# Patient Record
Sex: Female | Born: 1985
Health system: Southern US, Community
[De-identification: ages and names within clinical notes are randomized; demographics above are authoritative.]

## PROBLEM LIST (undated history)

## (undated) DIAGNOSIS — Z860100 Personal history of colon polyps, unspecified: Secondary | ICD-10-CM

## (undated) DIAGNOSIS — K862 Cyst of pancreas: Secondary | ICD-10-CM

## (undated) DIAGNOSIS — E1140 Type 2 diabetes mellitus with diabetic neuropathy, unspecified: Secondary | ICD-10-CM

## (undated) DIAGNOSIS — E119 Type 2 diabetes mellitus without complications: Secondary | ICD-10-CM

## (undated) DIAGNOSIS — I10 Essential (primary) hypertension: Secondary | ICD-10-CM

## (undated) DIAGNOSIS — E282 Polycystic ovarian syndrome: Secondary | ICD-10-CM

## (undated) DIAGNOSIS — K8020 Calculus of gallbladder without cholecystitis without obstruction: Secondary | ICD-10-CM

## (undated) HISTORY — DX: Personal history of colonic polyps: Z86.010

## (undated) HISTORY — DX: Essential (primary) hypertension: I10

## (undated) HISTORY — DX: Type 2 diabetes mellitus without complications: E11.9

## (undated) HISTORY — PX: EXPLORATORY LAPAROTOMY: SUR591

## (undated) HISTORY — DX: Type 2 diabetes mellitus with diabetic neuropathy, unspecified: E11.40

## (undated) HISTORY — DX: Cyst of pancreas: K86.2

## (undated) HISTORY — DX: Personal history of colon polyps, unspecified: Z86.0100

---
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
## 2012-03-26 HISTORY — PX: CHOLECYSTECTOMY: SHX55

## 2013-05-10 LAB — HM DIABETES EYE EXAM

## 2014-04-10 ENCOUNTER — Emergency Department (HOSPITAL_BASED_OUTPATIENT_CLINIC_OR_DEPARTMENT_OTHER)
Admission: EM | Admit: 2014-04-10 | Discharge: 2014-04-10 | Disposition: A | Discharge status: Home / Self Care | Payer: 59 | Attending: Emergency Medicine | Admitting: Emergency Medicine

## 2014-04-10 ENCOUNTER — Encounter (HOSPITAL_BASED_OUTPATIENT_CLINIC_OR_DEPARTMENT_OTHER): Payer: Self-pay | Admitting: Emergency Medicine

## 2014-04-10 ENCOUNTER — Emergency Department (HOSPITAL_BASED_OUTPATIENT_CLINIC_OR_DEPARTMENT_OTHER): Payer: 59

## 2014-04-10 DIAGNOSIS — R110 Nausea: Secondary | ICD-10-CM | POA: Diagnosis not present

## 2014-04-10 DIAGNOSIS — Z79899 Other long term (current) drug therapy: Secondary | ICD-10-CM | POA: Insufficient documentation

## 2014-04-10 DIAGNOSIS — R1032 Left lower quadrant pain: Secondary | ICD-10-CM | POA: Diagnosis present

## 2014-04-10 DIAGNOSIS — Z3202 Encounter for pregnancy test, result negative: Secondary | ICD-10-CM | POA: Insufficient documentation

## 2014-04-10 DIAGNOSIS — Z9049 Acquired absence of other specified parts of digestive tract: Secondary | ICD-10-CM | POA: Diagnosis not present

## 2014-04-10 DIAGNOSIS — E669 Obesity, unspecified: Secondary | ICD-10-CM | POA: Insufficient documentation

## 2014-04-10 DIAGNOSIS — Z8719 Personal history of other diseases of the digestive system: Secondary | ICD-10-CM | POA: Insufficient documentation

## 2014-04-10 HISTORY — DX: Calculus of gallbladder without cholecystitis without obstruction: K80.20

## 2014-04-10 HISTORY — DX: Polycystic ovarian syndrome: E28.2

## 2014-04-10 LAB — URINALYSIS, ROUTINE W REFLEX MICROSCOPIC
BILIRUBIN URINE: NEGATIVE
Glucose, UA: 100 mg/dL — AB
HGB URINE DIPSTICK: NEGATIVE
KETONES UR: NEGATIVE mg/dL
NITRITE: NEGATIVE
Protein, ur: NEGATIVE mg/dL
SPECIFIC GRAVITY, URINE: 1.021 (ref 1.005–1.030)
Urobilinogen, UA: 0.2 mg/dL (ref 0.0–1.0)
pH: 6.5 (ref 5.0–8.0)

## 2014-04-10 LAB — PREGNANCY, URINE: PREG TEST UR: NEGATIVE

## 2014-04-10 LAB — URINE MICROSCOPIC-ADD ON

## 2014-04-10 MED ORDER — MORPHINE SULFATE 4 MG/ML IJ SOLN
4.0000 mg | Freq: Once | INTRAMUSCULAR | Status: AC
  Administered 2014-04-10: 4 mg via INTRAVENOUS
  Filled 2014-04-10: qty 1

## 2014-04-10 MED ORDER — OXYCODONE-ACETAMINOPHEN 5-325 MG PO TABS: 1.0000 | ORAL_TABLET | Freq: Four times a day (QID) | ORAL | Status: DC | PRN

## 2014-04-10 MED ORDER — ONDANSETRON HCL 4 MG/2ML IJ SOLN
4.0000 mg | Freq: Once | INTRAMUSCULAR | Status: AC
  Administered 2014-04-10: 4 mg via INTRAVENOUS
  Filled 2014-04-10: qty 2

## 2014-04-10 MED ORDER — ONDANSETRON 4 MG PO TBDP: ORAL_TABLET | ORAL | Status: DC

## 2014-04-10 MED ORDER — DIPHENHYDRAMINE HCL 50 MG/ML IJ SOLN
25.0000 mg | Freq: Once | INTRAMUSCULAR | Status: AC
  Administered 2014-04-10: 25 mg via INTRAVENOUS
  Filled 2014-04-10: qty 1

## 2014-04-10 NOTE — ED Notes (Signed)
C/o itching, benadryl ordered and given.

## 2014-04-10 NOTE — ED Notes (Signed)
Pt in [place]

## 2014-04-10 NOTE — Discharge Instructions (Signed)
Take percocet for severe pain only. No driving or operating heavy machinery while taking percocet. This medication may cause drowsiness. Take zofran as directed as needed for nausea. Follow up with your OB/GYN as soon as possible.  Abdominal Pain, Women Abdominal (stomach, pelvic, or belly) pain can be caused by many things. It is important to tell your doctor:  The location of the pain.  Does it come and go or is it present all the time?  Are there things that start the pain (eating certain foods, exercise)?  Are there other symptoms associated with the pain (fever, nausea, vomiting, diarrhea)? All of this is helpful to know when trying to find the cause of the pain. CAUSES   Stomach: virus or bacteria infection, or ulcer.  Intestine: appendicitis (inflamed appendix), regional ileitis (Crohn's disease), ulcerative colitis (inflamed colon), irritable bowel syndrome, diverticulitis (inflamed diverticulum of the colon), or cancer of the stomach or intestine.  Gallbladder disease or stones in the gallbladder.  Kidney disease, kidney stones, or infection.  Pancreas infection or cancer.  Fibromyalgia (pain disorder).  Diseases of the female organs:  Uterus: fibroid (non-cancerous) tumors or infection.  Fallopian tubes: infection or tubal pregnancy.  Ovary: cysts or tumors.  Pelvic adhesions (scar tissue).  Endometriosis (uterus lining tissue growing in the pelvis and on the pelvic organs).  Pelvic congestion syndrome (female organs filling up with blood just before the menstrual period).  Pain with the menstrual period.  Pain with ovulation (producing an egg).  Pain with an IUD (intrauterine device, birth control) in the uterus.  Cancer of the female organs.  Functional pain (pain not caused by a disease, may improve without treatment).  Psychological pain.  Depression. DIAGNOSIS  Your doctor will decide the seriousness of your pain by doing an examination.  Blood  tests.  X-rays.  Ultrasound.  CT scan (computed tomography, special type of X-ray).  MRI (magnetic resonance imaging).  Cultures, for infection.  Barium enema (dye inserted in the large intestine, to better view it with X-rays).  Colonoscopy (looking in intestine with a lighted tube).  Laparoscopy (minor surgery, looking in abdomen with a lighted tube).  Major abdominal exploratory surgery (looking in abdomen with a large incision). TREATMENT  The treatment will depend on the cause of the pain.   Many cases can be observed and treated at home.  Over-the-counter medicines recommended by your caregiver.  Prescription medicine.  Antibiotics, for infection.  Birth control pills, for painful periods or for ovulation pain.  Hormone treatment, for endometriosis.  Nerve blocking injections.  Physical therapy.  Antidepressants.  Counseling with a psychologist or psychiatrist.  Minor or major surgery. HOME CARE INSTRUCTIONS   Do not take laxatives, unless directed by your caregiver.  Take over-the-counter pain medicine only if ordered by your caregiver. Do not take aspirin because it can cause an upset stomach or bleeding.  Try a clear liquid diet (broth or water) as ordered by your caregiver. Slowly move to a bland diet, as tolerated, if the pain is related to the stomach or intestine.  Have a thermometer and take your temperature several times a day, and record it.  Bed rest and sleep, if it helps the pain.  Avoid sexual intercourse, if it causes pain.  Avoid stressful situations.  Keep your follow-up appointments and tests, as your caregiver orders.  If the pain does not go away with medicine or surgery, you may try:  Acupuncture.  Relaxation exercises (yoga, meditation).  Group therapy.  Counseling. SEEK MEDICAL  CARE IF:  °· You notice certain foods cause stomach pain. °· Your home care treatment is not helping your pain. °· You need stronger pain  medicine. °· You want your IUD removed. °· You feel faint or lightheaded. °· You develop nausea and vomiting. °· You develop a rash. °· You are having side effects or an allergy to your medicine. °SEEK IMMEDIATE MEDICAL CARE IF:  °· Your pain does not go away or gets worse. °· You have a fever. °· Your pain is felt only in portions of the abdomen. The right side could possibly be appendicitis. The left lower portion of the abdomen could be colitis or diverticulitis. °· You are passing blood in your stools (bright red or black tarry stools, with or without vomiting). °· You have blood in your urine. °· You develop chills, with or without a fever. °· You pass out. °MAKE SURE YOU:  °· Understand these instructions. °· Will watch your condition. °· Will get help right away if you are not doing well or get worse. °Document Released: 01/07/2007 Document Revised: 07/27/2013 Document Reviewed: 01/27/2009 °[employer]® Patient Information ©2015 [employer]. This information is not intended to replace advice given to you by your health care provider. Make sure you discuss any questions you have with your health care provider. ° °

## 2014-04-10 NOTE — ED Notes (Signed)
ED PA at [facility] 

## 2014-04-10 NOTE — ED Provider Notes (Signed)
CSN: [id number]     Arrival date & time 04/10/14  2126 History   First MD Initiated Contact with Patient 04/10/14 2149     Chief Complaint  Patient presents with  . Abdominal Pain     (Consider location/radiation/quality/duration/timing/severity/associated sxs/prior Treatment) HPI Comments: 29 y/o female with a PMHx of PCOS and gallstones presenting with gradual onset non-radiating left lower quadrant abdominal pain beginning this evening, a few hours prior to arrival. Patient reports the pain feels similar to her prior ovarian cysts. Admits to associated nausea without vomiting. Tried taking two hydrocodone, last dose 3 hours PTA. Denies fever, chills, diarrhea, increased urinary frequency, urgency, dysuria, vaginal discharge or bleeding. She reports being 4 days late on her menstrual cycle. Last menstrual cycle began December 17. States she occasionally as late on her cycle due to PCOS. She is not on birth control and does not use protection, recently got married and has been trying to see if she can get pregnant. Last pelvic US October 2015 by GYN. Had full pelvic November 2015 by GYN without acute findings.  Patient is a 29 y.o. female presenting with abdominal pain. The history is provided by the patient.  Abdominal Pain Associated symptoms: nausea     Past Medical History  Diagnosis Date  . PCOS (polycystic ovarian syndrome)   . Gallstones    Past Surgical History  Procedure Laterality Date  . Cholecystectomy     No family history on file. History  Substance Use Topics  . Smoking status: Not on file  . Smokeless tobacco: Not on file  . Alcohol Use: Not on file   OB History    No data available     Review of Systems  Gastrointestinal: Positive for nausea and abdominal pain.  All other systems reviewed and are negative.     Allergies  Mobic; Pineapple; Plan b; and Progesterone  Home Medications   Prior to Admission medications   Medication Sig Start Date End Date  Taking? Authorizing Provider  Multiple Vitamin (MULTIVITAMIN) capsule Take 1 capsule by mouth daily.   Yes Historical Provider, MD  ondansetron (ZOFRAN ODT) 4 MG disintegrating tablet  ODT q4 hours prn nausea/vomit 04/10/14   Tameko Halder M Meghan Tiemann, PA-C  oxyCODONE-acetaminophen (PERCOCET) 5-325 MG per tablet Take 1-2 tablets by mouth every 6 (six) hours as needed for severe pain. 04/10/14   Julie-Anne Torain M Katlyne Nishida, PA-C   BP 133/82 mmHg  Pulse 85  Temp(Src) 98.3 F (36.8 C) (Oral)  Resp 20  Ht  (1.727 m)  Wt 243 lb 8 oz (110.451 kg)  BMI 37.03 kg/m2  SpO2 100%  LMP 03/11/2014 Physical Exam  Constitutional: She is oriented to person, place, and time. She appears well-developed and well-nourished. No distress.  Obese.  HENT:  Head: Normocephalic and atraumatic.  Mouth/Throat: Oropharynx is clear and moist.  Eyes: Conjunctivae are normal.  Neck: Normal range of motion. Neck supple.  Cardiovascular: Normal rate, regular rhythm and normal heart sounds.   Pulmonary/Chest: Effort normal and breath sounds normal.  Abdominal: Soft. Normal appearance and bowel sounds are normal. She exhibits no distension.  LLQ tenderness. No rigidity, guarding or rebound. No peritoneal signs.  Genitourinary:  Deferred per patient.  Musculoskeletal: Normal range of motion. She exhibits no edema.  Neurological: She is alert and oriented to person, place, and time.  Skin: Skin is warm and dry. She is not diaphoretic.  Psychiatric: She has a normal mood and affect. Her behavior is normal.  Nursing note and vitals  reviewed.   ED Course  Procedures (including critical care time) Labs Review Labs Reviewed  URINALYSIS, ROUTINE W REFLEX MICROSCOPIC - Abnormal; Notable for the following:    Glucose, UA 100 (*)    Leukocytes, UA TRACE (*)    All other components within normal limits  URINE MICROSCOPIC-ADD ON - Abnormal; Notable for the following:    Bacteria, UA FEW (*)    All other components within normal limits   PREGNANCY, URINE    Imaging Review US Transvaginal Non-ob  04/10/2014   CLINICAL DATA:  Left lower quadrant pain with nausea. History of P closed.  EXAM: TRANSABDOMINAL AND TRANSVAGINAL ULTRASOUND OF PELVIS  TECHNIQUE: Both transabdominal and transvaginal ultrasound examinations of the pelvis were performed. Transabdominal technique was performed for global imaging of the pelvis including uterus, ovaries, adnexal regions, and pelvic cul-de-sac. It was necessary to proceed with endovaginal exam following the transabdominal exam to visualize the uterus, endometrium and both ovaries.  COMPARISON:  None  FINDINGS: Uterus  Measurements: 7.5 x 4.9 x 4.4 cm. No fibroids or other mass visualized.  Endometrium  Thickness: 13 mm.  No focal abnormality visualized.  Right ovary  Measurements: 5.8 x 2.4 x 1.9 cm. Volume of 13.8 cc. There are physiologic follicles not significantly increased in number nor peripherally distributed. No ovarian cyst. Blood flow seen to the ovarian parenchyma.  Left ovary  Measurements: 4.5 x 2.5 x 1.4 cm. Volume of 8.2 cc. There physiologic follicles, not significantly increased in number nor peripherally distributed. No ovarian cyst. Blood flow seen to the ovarian parenchyma.  Other findings  No free fluid.  IMPRESSION: 1. No ovarian cysts. Increased ovarian volume on the right, there are otherwise no sonographic findings of polycystic ovarian syndrome. 2. Endometrial thickness of 13 mm may be normal based on phase of patient's menstrual cycle. The uterus is otherwise normal.   Electronically Signed   By: Rubye Oaks M.D.   On: 04/10/2014 23:00   US Pelvis Complete  04/10/2014   CLINICAL DATA:  Left lower quadrant pain with nausea. History of P closed.  EXAM: TRANSABDOMINAL AND TRANSVAGINAL ULTRASOUND OF PELVIS  TECHNIQUE: Both transabdominal and transvaginal ultrasound examinations of the pelvis were performed. Transabdominal technique was performed for global imaging of the pelvis  including uterus, ovaries, adnexal regions, and pelvic cul-de-sac. It was necessary to proceed with endovaginal exam following the transabdominal exam to visualize the uterus, endometrium and both ovaries.  COMPARISON:  None  FINDINGS: Uterus  Measurements: 7.5 x 4.9 x 4.4 cm. No fibroids or other mass visualized.  Endometrium  Thickness: 13 mm.  No focal abnormality visualized.  Right ovary  Measurements: 5.8 x 2.4 x 1.9 cm. Volume of 13.8 cc. There are physiologic follicles not significantly increased in number nor peripherally distributed. No ovarian cyst. Blood flow seen to the ovarian parenchyma.  Left ovary  Measurements: 4.5 x 2.5 x 1.4 cm. Volume of 8.2 cc. There physiologic follicles, not significantly increased in number nor peripherally distributed. No ovarian cyst. Blood flow seen to the ovarian parenchyma.  Other findings  No free fluid.  IMPRESSION: 1. No ovarian cysts. Increased ovarian volume on the right, there are otherwise no sonographic findings of polycystic ovarian syndrome. 2. Endometrial thickness of 13 mm may be normal based on phase of patient's menstrual cycle. The uterus is otherwise normal.   Electronically Signed   By: Rubye Oaks M.D.   On: 04/10/2014 23:00     EKG Interpretation None  MDM   Final diagnoses:  Abdominal pain, left lower quadrant  Nausea   Patient in no apparent distress. Afebrile, vital signs stable. Abdomen is soft, left lower quadrant tenderness to palpation without rigidity, guarding, rebound. No peritoneal signs. History of PCOS and feeling as if she had a ruptured cyst. Urinalysis negative for infection. Urine pregnancy negative. Pelvic ultrasound showing no ovarian cysts, increased ovarian volume on the right. Endometrial thickness of 13 mm, possibly relating to menstrual cycle. Patient is 4 days late for her menstrual cycle. Advised her to follow-up with her OB/GYN. She will call as soon as possible to schedule an appointment. No fevers,  vomiting or diarrhea concerning for any other intra-abdominal infection at this time. doubt PID. No vaginal discharge, monogamous with husband. Stable for discharge. Return precautions given. Patient states understanding of treatment care plan and is agreeable.  Kathrynn SpeedRobyn M Quiana Cobaugh, PA-C 04/10/14 2325  Vanetta MuldersScott Zackowski, MD 04/10/14 [phone]

## 2014-04-10 NOTE — ED Notes (Signed)
C/o L lower abd pain, h/o similar related to past L ovary cysts, also reports nausea (denies: vd, fever, UTI sx, injury, back pain, sob, dizziness, bleeding or other sx, last BM 2 hrs ago (normal), last ate 3 hrs ago, took hydrocodone 3 hrs ago[facility] flat supine for comfort.

## 2014-04-10 NOTE — ED Notes (Signed)
PT presents to ED with complaints of abdominal pain LLQ for the past few hours. Pt has history of ovarian cyst.

## 2014-04-10 NOTE — ED Notes (Addendum)
"  feel better", alert, NAD, calm, steady gait, denies questions, given CD of imaging to go, given Rx x2, out with mother, denies nausea at this time, itching improved. VSS.

## 2014-04-30 ENCOUNTER — Encounter: Payer: Self-pay | Admitting: Physician Assistant

## 2014-04-30 ENCOUNTER — Ambulatory Visit (INDEPENDENT_AMBULATORY_CARE_PROVIDER_SITE_OTHER): Payer: 59 | Admitting: Physician Assistant

## 2014-04-30 VITALS — BP 158/96 | HR 90 | Temp 98.5°F | Resp 16 | Ht 68.0 in | Wt 243.5 lb

## 2014-04-30 DIAGNOSIS — E282 Polycystic ovarian syndrome: Secondary | ICD-10-CM

## 2014-04-30 DIAGNOSIS — G629 Polyneuropathy, unspecified: Secondary | ICD-10-CM

## 2014-04-30 DIAGNOSIS — E119 Type 2 diabetes mellitus without complications: Secondary | ICD-10-CM

## 2014-04-30 LAB — COMPREHENSIVE METABOLIC PANEL
ALT: 51 U/L — ABNORMAL HIGH (ref 0–35)
AST: 34 U/L (ref 0–37)
Albumin: 4.4 g/dL (ref 3.5–5.2)
Alkaline Phosphatase: 51 U/L (ref 39–117)
BUN: 19 mg/dL (ref 6–23)
CALCIUM: 9.5 mg/dL (ref 8.4–10.5)
CO2: 22 mEq/L (ref 19–32)
Chloride: 104 mEq/L (ref 96–112)
Creatinine, Ser: 0.66 mg/dL (ref 0.40–1.20)
GFR: 114.83 mL/min (ref >60.00)
Glucose, Bld: 165 mg/dL — ABNORMAL HIGH (ref 70–99)
Potassium: 4 mEq/L (ref 3.5–5.1)
Sodium: 136 mEq/L (ref 135–145)
Total Bilirubin: 0.8 mg/dL (ref 0.2–1.2)
Total Protein: 7.1 g/dL (ref 6.0–8.3)

## 2014-04-30 LAB — CBC
HEMATOCRIT: 44 % (ref 36.0–46.0)
Hemoglobin: 15.6 g/dL — ABNORMAL HIGH (ref 12.0–15.0)
MCHC: 35.6 g/dL (ref 30.0–36.0)
MCV: 90.5 fl (ref 78.0–100.0)
PLATELETS: 205 10*3/uL (ref 150.0–400.0)
RBC: 4.86 Mil/uL (ref 3.87–5.11)
RDW: 12.4 % (ref 11.5–15.5)
WBC: 6.9 10*3/uL (ref 4.0–10.5)

## 2014-04-30 LAB — HEMOGLOBIN A1C: HEMOGLOBIN A1C: 7.2 % — AB (ref 4.6–6.5)

## 2014-04-30 LAB — VITAMIN B12: Vitamin B-12: 411 pg/mL (ref 211–911)

## 2014-04-30 MED ORDER — GABAPENTIN 100 MG PO CAPS: 100.0000 mg | ORAL_CAPSULE | Freq: Two times a day (BID) | ORAL | Status: DC

## 2014-04-30 NOTE — Progress Notes (Signed)
Patient presents to clinic today to establish care.  Acute Concerns: Patient endorses endorses burning sensation in feet bilaterally.  Endorses some mild spasms in feet at night time. Is painful.  Is non-radiating.  Endorses glucose previously well controlled.  Chronic Issues: PCOS -- Followed by OB/GYN.  Is given Hydrocodone by her specialist for ruptured cysts.  Is not currently on any other medication.  Diabetes Mellitus -- Endorses maintained by diet and exercise.  Endorses last A1C at 6.7.  Lost 50 pounds.  Denies complications.  Is due for repeat A1C.  Will be checking today.  Health Maintenance: Dental -- up-to-date Vision -- up-to-date Immunizations -- endorses up-to-date PAP --  Endorses up-to-date; last in 2015.  No abnormal findings  Past Medical History  Diagnosis Date  . PCOS (polycystic ovarian syndrome)   . Gallstones   . Diabetes mellitus type II, controlled     Past Surgical History  Procedure Laterality Date  . Cholecystectomy  2014  . Colonoscopy  2005    Current Outpatient Prescriptions on File Prior to Visit  Medication Sig Dispense Refill  . Multiple Vitamin (MULTIVITAMIN) capsule Take 1 capsule by mouth daily. [place] with Folic Acid     No current facility-administered medications on file prior to visit.    Allergies  Allergen Reactions  . Plan B [Levonorgestrel] Anaphylaxis  . Progesterone Anaphylaxis  . Mobic [Meloxicam] Rash  . Pineapple Palpitations and Rash    Family History  Problem Relation Age of Onset  . Diabetes Mother     Living  . Hypertension Mother   . Uterine cancer Mother     Pre-cancerous  . Thyroid disease Mother   . Diabetes type II Father     Living  . Hypertension Father   . Gout Father   . Neuropathy Father   . Breast cancer Maternal Grandmother   . Heart attack Paternal Grandfather   . Colon cancer Paternal Grandfather   . Colon cancer Maternal Grandfather   . Heart defect Brother   .  Prostate cancer Maternal Uncle   . Alcoholism Other     Paternal Side  . Diabetes Maternal Aunt   . Diabetes Maternal Uncle     History   Social History  . Marital Status: Married    Spouse Name: N/A  . Number of Children: N/A  . Years of Education: N/A   Occupational History  . Not on file.   Social History Main Topics  . Smoking status: Never Smoker   . Smokeless tobacco: Never Used  . Alcohol Use: 0.0 oz/week    0 Standard drinks or equivalent per week     Comment: rare  . Drug Use: No  . Sexual Activity:    Partners: Male   Other Topics Concern  . Not on file   Social History Narrative   ROS Pertinent ROS are listed in the HPI.  BP 158/96 mmHg  Pulse 90  Temp(Src) 98.5 F (36.9 C) (Oral)  Resp 16  Ht _0  (1.727 m)  Wt 243 lb 8 oz (110.451 kg)  BMI 37.03 kg/m2  SpO2 100%  LMP 04/15/2014  Physical Exam  Constitutional: She is well-developed, well-nourished, and in no distress.  HENT:  Head: Normocephalic and atraumatic.  Right Ear: Tympanic membrane, external ear and ear canal normal.  Left Ear: Tympanic membrane, external ear and ear canal normal.  Nose: Nose normal. No mucosal edema.  Mouth/Throat: Uvula is midline, oropharynx is clear and moist and mucous  membranes are normal. No oropharyngeal exudate or posterior oropharyngeal erythema.  Eyes: Conjunctivae are normal. Pupils are equal, round, and reactive to light.  Neck: Neck supple. No thyromegaly present.  Cardiovascular: Normal rate, regular rhythm, normal heart sounds and intact distal pulses.   Pulmonary/Chest: Effort normal and breath sounds normal. No respiratory distress. She has no wheezes. She has no rales.  Abdominal: Soft. Bowel sounds are normal. She exhibits no distension and no mass. There is no tenderness. There is no rebound and no guarding.  Lymphadenopathy:    She has no cervical adenopathy.  Neurological: No cranial nerve deficit.  Skin: Skin is warm and dry. No rash noted.    Mild hirsutism noted.  Psychiatric: Affect normal.  Vitals reviewed.   Recent Results (from the past 2160 hour(s))  Urinalysis, Routine w reflex microscopic     Status: Abnormal   Collection Time: 04/10/14 10:34 PM  Result Value Ref Range   Color, Urine YELLOW YELLOW   APPearance CLEAR CLEAR   Specific Gravity, Urine 1.021 1.005 - 1.030   pH 6.5 5.0 - 8.0   Glucose, UA 100 (A) NEGATIVE mg/dL   Hgb urine dipstick NEGATIVE NEGATIVE   Bilirubin Urine NEGATIVE NEGATIVE   Ketones, ur NEGATIVE NEGATIVE mg/dL   Protein, ur NEGATIVE NEGATIVE mg/dL   Urobilinogen, UA 0.2 0.0 - 1.0 mg/dL   Nitrite NEGATIVE NEGATIVE   Leukocytes, UA TRACE (A) NEGATIVE  Pregnancy, urine     Status: None   Collection Time: 04/10/14 10:34 PM  Result Value Ref Range   Preg Test, Ur NEGATIVE NEGATIVE    Comment:        THE SENSITIVITY OF THIS METHODOLOGY IS >20 mIU/mL.   Urine microscopic-add on     Status: Abnormal   Collection Time: 04/10/14 10:34 PM  Result Value Ref Range   Squamous Epithelial / LPF RARE RARE   WBC, UA 0-2 <3 WBC/hpf   RBC / HPF 0-2 <3 RBC/hpf   Bacteria, UA FEW (A) RARE  CBC     Status: Abnormal   Collection Time: 04/30/14  2:33 PM  Result Value Ref Range   WBC 6.9 4.0 - 10.5 K/uL   RBC 4.86 3.87 - 5.11 Mil/uL   Platelets 205.0 150.0 - 400.0 K/uL   Hemoglobin 15.6 (H) 12.0 - 15.0 g/dL   HCT 44.0 36.0 - 46.0 %   MCV 90.5 78.0 - 100.0 fl   MCHC 35.6 30.0 - 36.0 g/dL   RDW 12.4 11.5 - 15.5 %  Comp Met (CMET)     Status: Abnormal   Collection Time: 04/30/14  2:33 PM  Result Value Ref Range   Sodium 136 135 - 145 mEq/L   Potassium 4.0 3.5 - 5.1 mEq/L   Chloride 104 96 - 112 mEq/L   CO2 22 19 - 32 mEq/L   Glucose, Bld 165 (H) 70 - 99 mg/dL   BUN 19 6 - 23 mg/dL   Creatinine, Ser 0.66 0.40 - 1.20 mg/dL   Total Bilirubin 0.8 0.2 - 1.2 mg/dL   Alkaline Phosphatase 51 39 - 117 U/L   AST 34 0 - 37 U/L   ALT 51 (H) 0 - 35 U/L   Total Protein 7.1 6.0 - 8.3 g/dL   Albumin 4.4  3.5 - 5.2 g/dL   Calcium 9.5 8.4 - 10.5 mg/dL   GFR 114.83 >60.00 mL/min  B12     Status: None   Collection Time: 04/30/14  2:33 PM  Result Value Ref Range  Vitamin B-12 411 211 - 911 pg/mL  Hemoglobin A1c     Status: Abnormal   Collection Time: 04/30/14  2:33 PM  Result Value Ref Range   Hgb A1c MFr Bld 7.2 (H) 4.6 - 6.5 %    Comment: Glycemic Control Guidelines for People with Diabetes:Non Diabetic:  <6%Goal of Therapy: <7%Additional Action Suggested:  >8%     Assessment/Plan: Diabetes mellitus type II, controlled Will obtain A1C today to help assess glycemic control.  Continue TLC.   PCOS (polycystic ovarian syndrome) Followed by OB/GYN.   PCOS with diagnosed diabetes mellitus.  Patient refusing Metformin therapy at present to help with insulin resistance. Follow-up as scheduled with specialist.   Peripheral neuropathy Will obtain workup today to include A1C, CMP, CBC and B12.  Will being gabapentin 100 mg BID. Follow-up in 3-4 weeks.

## 2014-04-30 NOTE — Patient Instructions (Signed)
Please stop by the lab for blood work. I will call you with your results. If all look good, I will be setting you up with a Neurologist. Please take the Gabapentin once daily for 3 days before increasing to twice daily.  Follow-up with me in 1 month.  DASH Eating Plan DASH stands for "Dietary Approaches to Stop Hypertension." The DASH eating plan is a healthy eating plan that has been shown to reduce high blood pressure (hypertension). Additional health benefits may include reducing the risk of type 2 diabetes mellitus, heart disease, and stroke. The DASH eating plan may also help with weight loss. WHAT DO I NEED TO KNOW ABOUT THE DASH EATING PLAN? For the DASH eating plan, you will follow these general guidelines:  Choose foods with a percent daily value for sodium of less than 5% (as listed on the food label).  Use salt-free seasonings or herbs instead of table salt or sea salt.  Check with your health care provider or pharmacist before using salt substitutes.  Eat lower-sodium products, often labeled as "lower sodium" or "no salt added."  Eat fresh foods.  Eat more vegetables, fruits, and low-fat dairy products.  Choose whole grains. Look for the word "whole" as the first word in the ingredient list.  Choose fish and skinless chicken or [place] more often than red meat. Limit fish, poultry, and meat to 6 oz (170 g) each day.  Limit sweets, desserts, sugars, and sugary drinks.  Choose heart-healthy fats.  Limit cheese to 1 oz (28 g) per day.  Eat more home-cooked food and less restaurant, buffet, and fast food.  Limit fried foods.  Cook foods using methods other than frying.  Limit canned vegetables. If you do use them, rinse them well to decrease the sodium.  When eating at a restaurant, ask that your food be prepared with less salt, or no salt if possible. WHAT FOODS CAN I EAT? Seek help from a dietitian for individual calorie needs. Grains Whole grain or whole wheat  bread. Brown rice. Whole grain or whole wheat pasta. Quinoa, bulgur, and whole grain cereals. Low-sodium cereals. Corn or whole wheat flour tortillas. Whole grain cornbread. Whole grain crackers. Low-sodium crackers. Vegetables Fresh or frozen vegetables (raw, steamed, roasted, or grilled). Low-sodium or reduced-sodium tomato and vegetable juices. Low-sodium or reduced-sodium tomato sauce and paste. Low-sodium or reduced-sodium canned vegetables.  Fruits All fresh, canned (in natural juice), or frozen fruits. Meat and Other Protein Products Ground beef (85% or leaner), grass-fed beef, or beef trimmed of fat. Skinless chicken or [place]key. Ground chicken or [place]key. Pork trimmed of fat. All fish and seafood. Eggs. Dried beans, peas, or lentils. Unsalted nuts and seeds. Unsalted canned beans. Dairy Low-fat dairy products, such as skim or 1% milk, 2% or reduced-fat cheeses, low-fat ricotta or cottage cheese, or plain low-fat yogurt. Low-sodium or reduced-sodium cheeses. Fats and Oils Tub margarines without trans fats. Light or reduced-fat mayonnaise and salad dressings (reduced sodium). Avocado. Safflower, olive, or canola oils. Natural peanut or almond butter. Other Unsalted popcorn and pretzels. The items listed above may not be a complete list of recommended foods or beverages. Contact your dietitian for more options. WHAT FOODS ARE NOT RECOMMENDED? Grains White bread. White pasta. White rice. Refined cornbread. Bagels and croissants. Crackers that contain trans fat. Vegetables Creamed or fried vegetables. Vegetables in a cheese sauce. Regular canned vegetables. Regular canned tomato sauce and paste. Regular tomato and vegetable juices. Fruits Dried fruits. Canned fruit in light or heavy syrup.  Fruit juice. Meat and Other Protein Products Fatty cuts of meat. Ribs, chicken wings, bacon, sausage, bologna, salami, chitterlings, fatback, hot dogs, bratwurst, and packaged luncheon meats. Salted nuts and  seeds. Canned beans with salt. Dairy Whole or 2% milk, cream, half-and-half, and cream cheese. Whole-fat or sweetened yogurt. Full-fat cheeses or blue cheese. Nondairy creamers and whipped toppings. Processed cheese, cheese spreads, or cheese curds. Condiments Onion and garlic salt, seasoned salt, table salt, and sea salt. Canned and packaged gravies. Worcestershire sauce. Tartar sauce. Barbecue sauce. Teriyaki sauce. Soy sauce, including reduced sodium. Steak sauce. Fish sauce. Oyster sauce. Cocktail sauce. Horseradish. Ketchup and mustard. Meat flavorings and tenderizers. Bouillon cubes. Hot sauce. Tabasco sauce. Marinades. Taco seasonings. Relishes. Fats and Oils Butter, stick margarine, lard, shortening, ghee, and bacon fat. Coconut, palm kernel, or palm oils. Regular salad dressings. Other Pickles and olives. Salted popcorn and pretzels. The items listed above may not be a complete list of foods and beverages to avoid. Contact your dietitian for more information. WHERE CAN I FIND MORE INFORMATION? National Heart, Lung, and Blood Institute: CablePromo.itwww.nhlbi.nih.gov/health/health-topics/topics/dash/ Document Released: 03/01/2011 Document Revised: 07/27/2013 Document Reviewed: 01/14/2013 [facility] Patient Information 2015 [place]. This information is not intended to replace advice given to you by your health care provider. Make sure you discuss any questions you have with your health care provider.

## 2014-04-30 NOTE — Progress Notes (Signed)
Pre visit review using our clinic review tool, if applicable. No additional management support is needed unless otherwise documented below in the visit note/SLS  

## 2014-05-03 ENCOUNTER — Telehealth: Payer: Self-pay | Admitting: Physician Assistant

## 2014-05-03 DIAGNOSIS — R259 Unspecified abnormal involuntary movements: Secondary | ICD-10-CM

## 2014-05-03 NOTE — Telephone Encounter (Signed)
-----   Message from Regis BillSharon L Scates, CMA sent at 05/03/2014  4:08 PM EST ----- Regarding: results - referral Patient informed, understood & agreed; Gabapentin causing drowsiness, will only be taking once daily, Ok to proceed forward with Neurology referral/SLS  ----- Message -----    From: Waldon MerlWilliam C Osaze Hubbert, PA-C    Sent: 05/03/2014   7:04 AM      To: Regis BillSharon L Scates, CMA  Labs good overall.  A1C at 7.2 so a little above goal of 7.0.  Still pretty decently controlled.  I think it is still acceptable to control levels with diet and exercise.  Want to recheck in 6 months, because if increasing we will need to start medication.  Her liver enzymes were just slightly elevated which is likely due to her pain medication.  Limit use (only when needed) and we will recheck her liver function in 1 month.  Lastly other labs look good so no other cause for her neuropathic pain.  How is the gabapentin working? Due to the spasms in her feet and ankles, I would like her to see a Neurologist.  Is she ok with me setting this up?

## 2014-05-03 NOTE — Telephone Encounter (Signed)
Referral to Neurology placed.

## 2014-05-09 DIAGNOSIS — E282 Polycystic ovarian syndrome: Secondary | ICD-10-CM | POA: Insufficient documentation

## 2014-05-09 DIAGNOSIS — G629 Polyneuropathy, unspecified: Secondary | ICD-10-CM | POA: Insufficient documentation

## 2014-05-09 DIAGNOSIS — E1165 Type 2 diabetes mellitus with hyperglycemia: Secondary | ICD-10-CM | POA: Insufficient documentation

## 2014-05-09 DIAGNOSIS — IMO0002 Reserved for concepts with insufficient information to code with codable children: Secondary | ICD-10-CM | POA: Insufficient documentation

## 2014-05-09 NOTE — Assessment & Plan Note (Signed)
Will obtain A1C today to help assess glycemic control.  Continue TLC.

## 2014-05-09 NOTE — Assessment & Plan Note (Signed)
Followed by OB/GYN.   PCOS with diagnosed diabetes mellitus.  Patient refusing Metformin therapy at present to help with insulin resistance. Follow-up as scheduled with specialist.

## 2014-05-09 NOTE — Assessment & Plan Note (Signed)
Will obtain workup today to include A1C, CMP, CBC and B12.  Will being gabapentin 100 mg BID. Follow-up in 3-4 weeks.

## 2014-05-19 ENCOUNTER — Encounter: Payer: Self-pay | Admitting: Physician Assistant

## 2014-05-21 ENCOUNTER — Encounter: Payer: Self-pay | Admitting: Neurology

## 2014-05-21 ENCOUNTER — Ambulatory Visit (INDEPENDENT_AMBULATORY_CARE_PROVIDER_SITE_OTHER): Payer: 59 | Admitting: Neurology

## 2014-05-21 VITALS — BP 170/90 | HR 100 | Ht 68.0 in | Wt 248.0 lb

## 2014-05-21 DIAGNOSIS — E1140 Type 2 diabetes mellitus with diabetic neuropathy, unspecified: Secondary | ICD-10-CM

## 2014-05-21 DIAGNOSIS — R292 Abnormal reflex: Secondary | ICD-10-CM

## 2014-05-21 DIAGNOSIS — G629 Polyneuropathy, unspecified: Secondary | ICD-10-CM

## 2014-05-21 DIAGNOSIS — IMO0001 Reserved for inherently not codable concepts without codable children: Secondary | ICD-10-CM

## 2014-05-21 MED ORDER — LIDOCAINE 5 % EX OINT: 1.0000 "application " | TOPICAL_OINTMENT | Freq: Every evening | CUTANEOUS | Status: DC | PRN

## 2014-05-21 NOTE — Patient Instructions (Addendum)
1.  OK to try increasing gabapentin to 200mg  at bedtime 2.  Start using lidocaine ointment to feet.  Apply with gloves 3.  If your muscle twitches or spasms worsen, please call my office and we will schedule MRI cervical spine 4.  Keep up the good work with your weight loss plans 5.  Return to clinic as needed

## 2014-05-21 NOTE — Progress Notes (Signed)
[facility] - Initial Visit   Date: 05/21/2014  Veronica Mclaughlin: [id number] DOB: 07/24/1985   Dear Malva Coganody Martin, PA:  Thank you for your kind referral of Veronica Mclaughlin for consultation of bilateral feet pain. Although her history is well known to you, please allow us to reiterate it for the purpose of our medical record. The patient was accompanied to the clinic by self.    History of Present Illness: Veronica Mclaughlin is a 29 y.o. right-handed Caucasian female with type 2 diabetes mellitus (diagnosed 11/2012, HbA1c 7.2) presenting for evaluation of bilateral burning feet.    Starting in 2014, she developed burning sensation of the soles of the feet (right > left) usually in the evening.  She initially thought it was athlete's foot and tried a number of creams, but did not improve, so went to see her PCP who found her to be HbA1c 8.0 and it was attributed to diabetes.  Within a few weeks of loosing weight, burning sensation improved.    Late 2015, the burning sensation returned even worse than before, new sharp pain between her toes, and painless twitching of the feet.  Any type of light pressure, such as bed sheets, exacerbates her pain.  She takes gabapentin 100mg  at bedtime which helps her sleep through the night.  Her twitching has significantly improved since starting gabapentin and here lately, her pain in her feet is also improved.     She having intentional weight loss of 50lb from 11/2012 - 07/2013 to avoid taking medication for her diabetes.  Currently, she is not taking any medication and trying life style modification.  She has no associated weakness, balance problems, dry eyes, dry mouth, or anhydrosis.    Out-side paper records, electronic medical record, and images have been reviewed where available and summarized as:  Lab Results  Component Value Date   HGBA1C 7.2* 04/30/2014   Lab Results  Component Value Date   VITAMINB12 411  04/30/2014     Past Medical History  Diagnosis Date  . PCOS (polycystic ovarian syndrome)   . Gallstones   . Diabetes mellitus type II, controlled     Past Surgical History  Procedure Laterality Date  . Cholecystectomy  2014  . Colonoscopy  2005     Medications:  Current Outpatient Prescriptions on File Prior to Visit  Medication Sig Dispense Refill  . gabapentin (NEURONTIN) 100 MG capsule Take 1 capsule (100 mg total) by mouth 2 (two) times daily. (Patient taking differently: Take 100 mg by mouth daily. ) 60 capsule 3  . HYDROcodone-acetaminophen (NORCO/VICODIN) 5-325 MG per tablet Take 1 tablet by mouth every 4 (four) hours as needed for moderate pain.    . Multiple Vitamin (MULTIVITAMIN) capsule Take 1 capsule by mouth daily. Womens Reproductive Health with Folic Acid    . traZODone (DESYREL) 50 MG tablet Take 50 mg by mouth at bedtime as needed for sleep.     No current facility-administered medications on file prior to visit.    Allergies:  Allergies  Allergen Reactions  . Plan B [Levonorgestrel] Anaphylaxis  . Progesterone Anaphylaxis  . Mobic [Meloxicam] Rash  . Pineapple Palpitations and Rash    Family History: Family History  Problem Relation Age of Onset  . Diabetes Mother     Living  . Hypertension Mother   . Uterine cancer Mother     Pre-cancerous  . Thyroid disease Mother   . Diabetes type II Father     Living  .  Hypertension Father   . Gout Father   . Neuropathy Father   . Breast cancer Maternal Grandmother   . Heart attack Paternal Grandfather   . Colon cancer Paternal Grandfather   . Colon cancer Maternal Grandfather   . Heart defect Brother   . Prostate cancer Maternal Uncle   . Alcoholism Other     Paternal Side  . Diabetes Maternal Aunt   . Diabetes Maternal Uncle     Social History: History   Social History  . Marital Status: Married    Spouse Name: N/A  . Number of Children: N/A  . Years of Education: N/A   Occupational  History  . Not on file.   Social History Main Topics  . Smoking status: Never Smoker   . Smokeless tobacco: Never Used  . Alcohol Use: 0.0 oz/week    0 Standard drinks or equivalent per week     Comment: 1 glass of wine a month  . Drug Use: No  . Sexual Activity:    Partners: Male   Other Topics Concern  . Not on file   Social History Narrative   Lives with husband in a one story home.  No children.     Works as an Museum/gallery exhibitions officer.     Education: associates degree.    Review of Systems:  CONSTITUTIONAL: No fevers, chills, night sweats, + weight loss.   EYES: No visual changes or eye pain ENT: No hearing changes.  No history of nose bleeds.   RESPIRATORY: No cough, wheezing and shortness of breath.   CARDIOVASCULAR: Negative for chest pain, and palpitations.   GI: Negative for abdominal discomfort, blood in stools or black stools.  No recent change in bowel habits.   GU:  No history of incontinence.   MUSCLOSKELETAL: No history of joint pain or swelling.  No myalgias.   SKIN: Negative for lesions, rash, and itching.   HEMATOLOGY/ONCOLOGY: Negative for prolonged bleeding, bruising easily, and swollen nodes.  No history of cancer.   ENDOCRINE: Negative for cold or heat intolerance, polydipsia or goiter.   PSYCH:  No depression or anxiety symptoms.   NEURO: As Above.   Vital Signs:  BP 170/90 mmHg  Pulse 100  Ht 5\' 8"  (1.727 m)  Wt 248 lb (112.492 kg)  BMI 37.72 kg/m2  SpO2 99%  LMP 04/15/2014   General Medical Exam:   General:  Well appearing, comfortable.   Eyes/ENT: see cranial nerve examination.   Neck: No masses appreciated.  Full range of motion without tenderness.  No carotid bruits. Respiratory:  Clear to auscultation, good air entry bilaterally.   Cardiac:  Regular rate and rhythm, no murmur.   Extremities:  No deformities, edema, or skin discoloration.  Skin:  No rashes or lesions.  Neurological Exam: MENTAL STATUS including orientation to time, place, person,  recent and remote memory, attention span and concentration, language, and fund of knowledge is normal.  Speech is not dysarthric.  CRANIAL NERVES: II:  No visual field defects.  Unremarkable fundi.   III-IV-VI: Pupils equal round and reactive to light.  Normal conjugate, extra-ocular eye movements in all directions of gaze.  No nystagmus.  No ptosis.   V:  Normal facial sensation.  Jaw jerk is absent.   VII:  Normal facial symmetry and movements.  No pathologic facial reflexes.  VIII:  Normal hearing and vestibular function.   IX-X:  Normal palatal movement.   XI:  Normal shoulder shrug and head rotation.   XII:  Normal tongue strength and range of motion, no deviation or fasciculation.  MOTOR:  No atrophy, fasciculations or abnormal movements.  No pronator drift.  Tone is normal.    Right Upper Extremity:    Left Upper Extremity:    Deltoid  5/5   Deltoid  5/5   Biceps  5/5   Biceps  5/5   Triceps  5/5   Triceps  5/5   Wrist extensors  5/5   Wrist extensors  5/5   Wrist flexors  5/5   Wrist flexors  5/5   Finger extensors  5/5   Finger extensors  5/5   Finger flexors  5/5   Finger flexors  5/5   Dorsal interossei  5/5   Dorsal interossei  5/5   Abductor pollicis  5/5   Abductor pollicis  5/5   Tone (Ashworth scale)  0  Tone (Ashworth scale)  0   Right Lower Extremity:    Left Lower Extremity:    Hip flexors  5/5   Hip flexors  5/5   Hip extensors  5/5   Hip extensors  5/5   Knee flexors  5/5   Knee flexors  5/5   Knee extensors  5/5   Knee extensors  5/5   Dorsiflexors  5/5   Dorsiflexors  5/5   Plantarflexors  5/5   Plantarflexors  5/5   Toe extensors  5/5   Toe extensors  5/5   Toe flexors  5/5   Toe flexors  5/5   Tone (Ashworth scale)  0  Tone (Ashworth scale)  0   MSRs:  Right                                                                 Left brachioradialis 3+  brachioradialis 3+  biceps 3+  biceps 3+  triceps 3+  triceps 3+  patellar 3+  patellar 3+  ankle jerk 2+   ankle jerk 2+  Hoffman no  Hoffman no  plantar response down  plantar response down   SENSORY:  Normal and symmetric perception of light touch, pinprick, vibration, and proprioception.  Romberg's sign absent.   COORDINATION/GAIT: Normal finger-to- nose-finger and heel-to-shin.  Intact rapid alternating movements bilaterally.  Able to rise from a chair without using arms.  Gait narrow based and stable. Tandem and stressed gait intact.    IMPRESSION: Mrs. Marcelino is a 29 year-old female presenting for evaluation of bilateral feet pain and muscle twitching.  Her neurological examination is notable for generalized and symmetric brisk reflexes throughout, which may be normal for patient in the absence of other upper motor neuron signs. No abnormal movements were seen on exam.  With her new muscle twitches and hyperreflexia, I recommended checking MRI cervical spine if symptoms to do not improve.  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  I strongly encouraged her maintain tight glycemic control especially since she is not taking medication and is trying life style modification.  In the meantime, symptomatic treatment will be optimized.   PLAN/RECOMMENDATIONS:  1.  OK to try increasing gabapentin to  at bedtime 2.  Start using lidocaine ointment to feet.  Apply with gloves 3.  Encouraged weight loss 4.  If muscle twitches or spasms worsen, proceed with MRI cervical spine   The duration of this appointment visit was 40 minutes of face-to-face time with the patient.  Greater than 50% of this time was spent in counseling, explanation of diagnosis, planning of further management, and coordination of care.   Thank you for allowing me to participate in patient's care.  If I can answer any additional questions, I would be pleased to do so.    Sincerely,    Donika K. Allena Katz, DO

## 2014-05-25 LAB — HM DIABETES EYE EXAM

## 2014-05-31 ENCOUNTER — Encounter: Payer: Self-pay | Admitting: Physician Assistant

## 2014-05-31 ENCOUNTER — Ambulatory Visit (INDEPENDENT_AMBULATORY_CARE_PROVIDER_SITE_OTHER): Payer: 59 | Admitting: Physician Assistant

## 2014-05-31 VITALS — BP 152/103 | HR 99 | Temp 98.4°F | Ht 68.0 in | Wt 245.2 lb

## 2014-05-31 DIAGNOSIS — I10 Essential (primary) hypertension: Secondary | ICD-10-CM | POA: Insufficient documentation

## 2014-05-31 DIAGNOSIS — E119 Type 2 diabetes mellitus without complications: Secondary | ICD-10-CM

## 2014-05-31 DIAGNOSIS — T782XXA Anaphylactic shock, unspecified, initial encounter: Secondary | ICD-10-CM

## 2014-05-31 DIAGNOSIS — E282 Polycystic ovarian syndrome: Secondary | ICD-10-CM

## 2014-05-31 MED ORDER — EPINEPHRINE 0.3 MG/0.3ML IJ SOAJ: 0.3000 mg | Freq: Once | INTRAMUSCULAR | Status: DC

## 2014-05-31 MED ORDER — METFORMIN HCL ER 500 MG PO TB24: 500.0000 mg | ORAL_TABLET | Freq: Every day | ORAL | Status: DC

## 2014-05-31 MED ORDER — METOPROLOL SUCCINATE ER 25 MG PO TB24: 25.0000 mg | ORAL_TABLET | Freq: Every day | ORAL | Status: DC

## 2014-05-31 NOTE — Assessment & Plan Note (Signed)
Will begin Glucophage-XR 500 mg daily.  Patient with upcoming appointment with her OB/GYN. Encouraged her to keep that appointment.

## 2014-05-31 NOTE — Assessment & Plan Note (Signed)
In patient with DM, controlled. Discussed ACEI/ARB therapy for high BP in diabetic patients.  Patient is actively trying to get pregnant so ACEI/ARB not presently the best option as unsafe to fetus.  Will begin Toprol XL 25 mg daily.  DASH diet discussed.  Follow-up in 1 month.

## 2014-05-31 NOTE — Patient Instructions (Signed)
Please continue Gabapentin as directed.  Apply nickel-sized amount of lidocaine to feet at night.  Start the extended release metformin daily.   For blood pressure, start the Toprol-XL daily.  Read information below on dietary changes to help lower your blood pressure.  Follow-up in 1 month.  DASH Eating Plan DASH stands for "Dietary Approaches to Stop Hypertension." The DASH eating plan is a healthy eating plan that has been shown to reduce high blood pressure (hypertension). Additional health benefits may include reducing the risk of type 2 diabetes mellitus, heart disease, and stroke. The DASH eating plan may also help with weight loss. WHAT DO I NEED TO KNOW ABOUT THE DASH EATING PLAN? For the DASH eating plan, you will follow these general guidelines:  Choose foods with a percent daily value for sodium of less than 5% (as listed on the food label).  Use salt-free seasonings or herbs instead of table salt or sea salt.  Check with your health care provider or pharmacist before using salt substitutes.  Eat lower-sodium products, often labeled as "lower sodium" or "no salt added."  Eat fresh foods.  Eat more vegetables, fruits, and low-fat dairy products.  Choose whole grains. Look for the word "whole" as the first word in the ingredient list.  Choose fish and skinless chicken or [place] more often than red meat. Limit fish, poultry, and meat to 6 oz (170 g) each day.  Limit sweets, desserts, sugars, and sugary drinks.  Choose heart-healthy fats.  Limit cheese to 1 oz (28 g) per day.  Eat more home-cooked food and less restaurant, buffet, and fast food.  Limit fried foods.  Cook foods using methods other than frying.  Limit canned vegetables. If you do use them, rinse them well to decrease the sodium.  When eating at a restaurant, ask that your food be prepared with less salt, or no salt if possible. WHAT FOODS CAN I EAT? Seek help from a dietitian for individual calorie  needs. Grains Whole grain or whole wheat bread. Brown rice. Whole grain or whole wheat pasta. Quinoa, bulgur, and whole grain cereals. Low-sodium cereals. Corn or whole wheat flour tortillas. Whole grain cornbread. Whole grain crackers. Low-sodium crackers. Vegetables Fresh or frozen vegetables (raw, steamed, roasted, or grilled). Low-sodium or reduced-sodium tomato and vegetable juices. Low-sodium or reduced-sodium tomato sauce and paste. Low-sodium or reduced-sodium canned vegetables.  Fruits All fresh, canned (in natural juice), or frozen fruits. Meat and Other Protein Products Ground beef (85% or leaner), grass-fed beef, or beef trimmed of fat. Skinless chicken or [place]. Ground chicken or [place]. Pork trimmed of fat. All fish and seafood. Eggs. Dried beans, peas, or lentils. Unsalted nuts and seeds. Unsalted canned beans. Dairy Low-fat dairy products, such as skim or 1% milk, 2% or reduced-fat cheeses, low-fat ricotta or cottage cheese, or plain low-fat yogurt. Low-sodium or reduced-sodium cheeses. Fats and Oils Tub margarines without trans fats. Light or reduced-fat mayonnaise and salad dressings (reduced sodium). Avocado. Safflower, olive, or canola oils. Natural peanut or almond butter. Other Unsalted popcorn and pretzels. The items listed above may not be a complete list of recommended foods or beverages. Contact your dietitian for more options. WHAT FOODS ARE NOT RECOMMENDED? Grains White bread. White pasta. White rice. Refined cornbread. Bagels and croissants. Crackers that contain trans fat. Vegetables Creamed or fried vegetables. Vegetables in a cheese sauce. Regular canned vegetables. Regular canned tomato sauce and paste. Regular tomato and vegetable juices. Fruits Dried fruits. Canned fruit in light or heavy syrup.  Fruit juice. Meat and Other Protein Products Fatty cuts of meat. Ribs, chicken wings, bacon, sausage, bologna, salami, chitterlings, fatback, hot dogs, bratwurst,  and packaged luncheon meats. Salted nuts and seeds. Canned beans with salt. Dairy Whole or 2% milk, cream, half-and-half, and cream cheese. Whole-fat or sweetened yogurt. Full-fat cheeses or blue cheese. Nondairy creamers and whipped toppings. Processed cheese, cheese spreads, or cheese curds. Condiments Onion and garlic salt, seasoned salt, table salt, and sea salt. Canned and packaged gravies. Worcestershire sauce. Tartar sauce. Barbecue sauce. Teriyaki sauce. Soy sauce, including reduced sodium. Steak sauce. Fish sauce. Oyster sauce. Cocktail sauce. Horseradish. Ketchup and mustard. Meat flavorings and tenderizers. Bouillon cubes. Hot sauce. Tabasco sauce. Marinades. Taco seasonings. Relishes. Fats and Oils Butter, stick margarine, lard, shortening, ghee, and bacon fat. Coconut, palm kernel, or palm oils. Regular salad dressings. Other Pickles and olives. Salted popcorn and pretzels. The items listed above may not be a complete list of foods and beverages to avoid. Contact your dietitian for more information. WHERE CAN I FIND MORE INFORMATION? National Heart, Lung, and Blood Institute: travelstabloid.com Document Released: 03/01/2011 Document Revised: 07/27/2013 Document Reviewed: 01/14/2013 [facility] Patient Information 2015 [place], [place]. This information is not intended to replace advice given to you by your health care provider. Make sure you discuss any questions you have with your health care provider.

## 2014-05-31 NOTE — Assessment & Plan Note (Signed)
With PCOS.  Will begin Glucophage-XR 500 mg once daily. Limit carbs.  Stay active.  Follow-up 1 month.

## 2014-05-31 NOTE — Progress Notes (Signed)
Pre visit review using our clinic review tool, if applicable. No additional management support is needed unless otherwise documented below in the visit note. 

## 2014-05-31 NOTE — Progress Notes (Signed)
Patient presents to clinic today for 1 month follow-up of peripheral neuropathy.  Patient also with hx of PCOS and recent diagnosis of DM II with A1C at 7.2.  Previously refusing metformin.  Giving hyperreflexia noted at last visit, patient was sent to Neurology for further assessment.  Gabapentin increased at that visit to 200 mg nightly.    Neurology recommended patient reconsider Metformin.  Patient endorses only taking 100 mg gabapentin nightly.  Is using lidocaine ointment on her feet.  Notes significant improvement in symptoms.  Is wanting to revisit starting Metformin.  Of note patient's BP significantly elevated on last 3 visits.  Patient denies chest pain, palpitations, lightheadedness, dizziness, vision changes or frequent headaches.   Past Medical History  Diagnosis Date  . PCOS (polycystic ovarian syndrome)   . Gallstones   . Diabetes mellitus type II, controlled     Current Outpatient Prescriptions on File Prior to Visit  Medication Sig Dispense Refill  . gabapentin (NEURONTIN) 100 MG capsule Take 1 capsule (100 mg total) by mouth 2 (two) times daily. (Patient taking differently: Take 100 mg by mouth daily. ) 60 capsule 3  . HYDROcodone-acetaminophen (NORCO/VICODIN) 5-325 MG per tablet Take 1 tablet by mouth every 4 (four) hours as needed for moderate pain.    Marland Kitchen lidocaine (XYLOCAINE) 5 % ointment Apply 1 application topically at bedtime as needed. 35.44 g 3  . Multiple Vitamin (MULTIVITAMIN) capsule Take 1 capsule by mouth daily. [place] with Folic Acid    . traZODone (DESYREL) 50 MG tablet Take 50 mg by mouth at bedtime as needed for sleep.     No current facility-administered medications on file prior to visit.    Allergies  Allergen Reactions  . Plan B [Levonorgestrel] Anaphylaxis  . Progesterone Anaphylaxis  . Mobic [Meloxicam] Rash  . Pineapple Palpitations and Rash    Family History  Problem Relation Age of Onset  . Diabetes Mother    Living  . Hypertension Mother   . Uterine cancer Mother     Pre-cancerous  . Thyroid disease Mother   . Diabetes type II Father     Living  . Hypertension Father   . Gout Father   . Neuropathy Father   . Breast cancer Maternal Grandmother   . Heart attack Paternal Grandfather   . Colon cancer Paternal Grandfather   . Colon cancer Maternal Grandfather   . Heart defect Brother   . Prostate cancer Maternal Uncle   . Alcoholism Other     Paternal Side  . Diabetes Maternal Aunt   . Diabetes Maternal Uncle     History   Social History  . Marital Status: Married    Spouse Name: N/A  . Number of Children: N/A  . Years of Education: N/A   Social History Main Topics  . Smoking status: Never Smoker   . Smokeless tobacco: Never Used  . Alcohol Use: 0.0 oz/week    0 Standard drinks or equivalent per week     Comment: 1 glass of wine a month  . Drug Use: No  . Sexual Activity:    Partners: Male   Other Topics Concern  . None   Social History Narrative   Lives with husband in a one story home.  No children.     Works as an Public relations account executive.     Education: associates degree.   Review of Systems - See HPI.  All other ROS are negative.  BP 152/103 mmHg  Pulse 99  Temp(Src) 98.4 F (36.9 C) (Oral)  Ht '5\' 8"'  (1.727 m)  Wt 245 lb 3.2 oz (111.222 kg)  BMI 37.29 kg/m2  SpO2 99%  LMP 05/23/2014  Physical Exam  Constitutional: She is well-developed, well-nourished, and in no distress.  HENT:  Head: Normocephalic and atraumatic.  Eyes: Conjunctivae are normal. Pupils are equal, round, and reactive to light.  Neck: Neck supple.  Cardiovascular: Normal rate, regular rhythm, normal heart sounds and intact distal pulses.   Pulmonary/Chest: Effort normal and breath sounds normal. No respiratory distress. She has no wheezes. She has no rales. She exhibits no tenderness.  Skin: Skin is warm and dry. No rash noted.  Psychiatric: Affect normal.  Vitals reviewed.   Recent Results (from the  past 2160 hour(s))  Urinalysis, Routine w reflex microscopic     Status: Abnormal   Collection Time: 04/10/14 10:34 PM  Result Value Ref Range   Color, Urine YELLOW YELLOW   APPearance CLEAR CLEAR   Specific Gravity, Urine 1.021 1.005 - 1.030   pH 6.5 5.0 - 8.0   Glucose, UA 100 (A) NEGATIVE mg/dL   Hgb urine dipstick NEGATIVE NEGATIVE   Bilirubin Urine NEGATIVE NEGATIVE   Ketones, ur NEGATIVE NEGATIVE mg/dL   Protein, ur NEGATIVE NEGATIVE mg/dL   Urobilinogen, UA 0.2 0.0 - 1.0 mg/dL   Nitrite NEGATIVE NEGATIVE   Leukocytes, UA TRACE (A) NEGATIVE  Pregnancy, urine     Status: None   Collection Time: 04/10/14 10:34 PM  Result Value Ref Range   Preg Test, Ur NEGATIVE NEGATIVE    Comment:        THE SENSITIVITY OF THIS METHODOLOGY IS >20 mIU/mL.   Urine microscopic-add on     Status: Abnormal   Collection Time: 04/10/14 10:34 PM  Result Value Ref Range   Squamous Epithelial / LPF RARE RARE   WBC, UA 0-2 <3 WBC/hpf   RBC / HPF 0-2 <3 RBC/hpf   Bacteria, UA FEW (A) RARE  CBC     Status: Abnormal   Collection Time: 04/30/14  2:33 PM  Result Value Ref Range   WBC 6.9 4.0 - 10.5 K/uL   RBC 4.86 3.87 - 5.11 Mil/uL   Platelets 205.0 150.0 - 400.0 K/uL   Hemoglobin 15.6 (H) 12.0 - 15.0 g/dL   HCT 44.0 36.0 - 46.0 %   MCV 90.5 78.0 - 100.0 fl   MCHC 35.6 30.0 - 36.0 g/dL   RDW 12.4 11.5 - 15.5 %  Comp Met (CMET)     Status: Abnormal   Collection Time: 04/30/14  2:33 PM  Result Value Ref Range   Sodium 136 135 - 145 mEq/L   Potassium 4.0 3.5 - 5.1 mEq/L   Chloride 104 96 - 112 mEq/L   CO2 22 19 - 32 mEq/L   Glucose, Bld 165 (H) 70 - 99 mg/dL   BUN 19 6 - 23 mg/dL   Creatinine, Ser 0.66 0.40 - 1.20 mg/dL   Total Bilirubin 0.8 0.2 - 1.2 mg/dL   Alkaline Phosphatase 51 39 - 117 U/L   AST 34 0 - 37 U/L   ALT 51 (H) 0 - 35 U/L   Total Protein 7.1 6.0 - 8.3 g/dL   Albumin 4.4 3.5 - 5.2 g/dL   Calcium 9.5 8.4 - 10.5 mg/dL   GFR 114.83 >60.00 mL/min  B12     Status: None    Collection Time: 04/30/14  2:33 PM  Result Value Ref Range   Vitamin B-12 411 211 -  911 pg/mL  Hemoglobin A1c     Status: Abnormal   Collection Time: 04/30/14  2:33 PM  Result Value Ref Range   Hgb A1c MFr Bld 7.2 (H) 4.6 - 6.5 %    Comment: Glycemic Control Guidelines for People with Diabetes:Non Diabetic:  <6%Goal of Therapy: <7%Additional Action Suggested:  >8%     Assessment/Plan: Essential hypertension In patient with DM, controlled. Discussed ACEI/ARB therapy for high BP in diabetic patients.  Patient is actively trying to get pregnant so ACEI/ARB not presently the best option as unsafe to fetus.  Will begin Toprol XL 25 mg daily.  DASH diet discussed.  Follow-up in 1 month.   Diabetes mellitus type II, controlled With PCOS.  Will begin Glucophage-XR 500 mg once daily. Limit carbs.  Stay active.  Follow-up 1 month.   PCOS (polycystic ovarian syndrome) Will begin Glucophage-XR 500 mg daily.  Patient with upcoming appointment with her OB/GYN. Encouraged her to keep that appointment.   Anaphylactic reaction Rx Epipen.

## 2014-05-31 NOTE — Assessment & Plan Note (Signed)
-

## 2014-07-02 ENCOUNTER — Ambulatory Visit (INDEPENDENT_AMBULATORY_CARE_PROVIDER_SITE_OTHER): Payer: 59 | Admitting: Physician Assistant

## 2014-07-02 ENCOUNTER — Encounter: Payer: Self-pay | Admitting: Physician Assistant

## 2014-07-02 VITALS — BP 141/106 | HR 75 | Temp 98.2°F | Resp 16 | Ht 68.0 in | Wt 243.1 lb

## 2014-07-02 DIAGNOSIS — I10 Essential (primary) hypertension: Secondary | ICD-10-CM

## 2014-07-02 MED ORDER — LISINOPRIL 10 MG PO TABS: 10.0000 mg | ORAL_TABLET | Freq: Every day | ORAL | Status: DC

## 2014-07-02 NOTE — Patient Instructions (Signed)
Please stop the Toprol-XL and take the Lisinopril daily as directed. Limit salt intake.  Stay active.  Follow-up in 2 months.  You will be contacted by GI for repeat colonoscopy.

## 2014-07-02 NOTE — Progress Notes (Signed)
Patient presents to clinic today for one-month follow-up of hypertension. Patient previously placed on Toprol-XL for blood pressure, as she was trying to conceive. Patient also with history of diabetes and PCOS. Patient endorses taking medication as directed. Endorses blood pressure measurements at home have been above goal. Patient denies chest pain, palpitations, lightheadedness, dizziness, vision changes or frequent headaches.  Patient endorses her husband recently underwent fertility testing. States he is not able to have children at present. She would like to rediscuss ACE inhibitor.     Past Medical History  Diagnosis Date  . PCOS (polycystic ovarian syndrome)   . Gallstones   . Diabetes mellitus type II, controlled     Current Outpatient Prescriptions on File Prior to Visit  Medication Sig Dispense Refill  . EPINEPHrine 0.3 mg/0.3 mL IJ SOAJ injection Inject 0.3 mLs (0.3 mg total) into the muscle once. 1 Device 0  . gabapentin (NEURONTIN) 100 MG capsule Take 1 capsule (100 mg total) by mouth 2 (two) times daily. (Patient taking differently: Take 100 mg by mouth daily. ) 60 capsule 3  . HYDROcodone-acetaminophen (NORCO/VICODIN) 5-325 MG per tablet Take 1 tablet by mouth every 4 (four) hours as needed for moderate pain.    Marland Kitchen lidocaine (XYLOCAINE) 5 % ointment Apply 1 application topically at bedtime as needed. 35.44 g 3  . metFORMIN (GLUCOPHAGE-XR) 500 MG 24 hr tablet Take 1 tablet (500 mg total) by mouth daily with breakfast. 30 tablet 0  . Multiple Vitamin (MULTIVITAMIN) capsule Take 1 capsule by mouth daily. [place] with Folic Acid    . traZODone (DESYREL) 50 MG tablet Take 50 mg by mouth at bedtime as needed for sleep.     No current facility-administered medications on file prior to visit.    Allergies  Allergen Reactions  . Plan B [Levonorgestrel] Anaphylaxis  . Progesterone Anaphylaxis  . Mobic [Meloxicam] Rash  . Pineapple Palpitations and Rash     Family History  Problem Relation Age of Onset  . Diabetes Mother     Living  . Hypertension Mother   . Uterine cancer Mother     Pre-cancerous  . Thyroid disease Mother   . Diabetes type II Father     Living  . Hypertension Father   . Gout Father   . Neuropathy Father   . Breast cancer Maternal Grandmother   . Heart attack Paternal Grandfather   . Colon cancer Paternal Grandfather   . Colon cancer Maternal Grandfather   . Heart defect Brother   . Prostate cancer Maternal Uncle   . Alcoholism Other     Paternal Side  . Diabetes Maternal Aunt   . Diabetes Maternal Uncle     History   Social History  . Marital Status: Married    Spouse Name: N/A  . Number of Children: N/A  . Years of Education: N/A   Social History Main Topics  . Smoking status: Never Smoker   . Smokeless tobacco: Never Used  . Alcohol Use: 0.0 oz/week    0 Standard drinks or equivalent per week     Comment: 1 glass of wine a month  . Drug Use: No  . Sexual Activity:    Partners: Male   Other Topics Concern  . None   Social History Narrative   Lives with husband in a one story home.  No children.     Works as an Public relations account executive.     Education: associates degree.    Review of Systems -  See HPI.  All other ROS are negative.  BP 141/106 mmHg  Pulse 75  Temp(Src) 98.2 F (36.8 C) (Oral)  Resp 16  Ht '5\' 8"'  (1.727 m)  Wt 243 lb 2 oz (110.281 kg)  BMI 36.98 kg/m2  SpO2 100%  LMP 06/25/2014  Physical Exam  Constitutional: She is oriented to person, place, and time and well-developed, well-nourished, and in no distress.  HENT:  Head: Normocephalic and atraumatic.  Eyes: Conjunctivae are normal.  Neck: Neck supple.  Cardiovascular: Normal rate, regular rhythm, normal heart sounds and intact distal pulses.   Pulmonary/Chest: Effort normal and breath sounds normal. No respiratory distress. She has no wheezes. She has no rales. She exhibits no tenderness.  Neurological: She is alert and oriented to  person, place, and time.  Skin: Skin is warm and dry. No rash noted.  Psychiatric: Affect normal.  Vitals reviewed.   Recent Results (from the past 2160 hour(s))  Urinalysis, Routine w reflex microscopic     Status: Abnormal   Collection Time: 04/10/14 10:34 PM  Result Value Ref Range   Color, Urine YELLOW YELLOW   APPearance CLEAR CLEAR   Specific Gravity, Urine 1.021 1.005 - 1.030   pH 6.5 5.0 - 8.0   Glucose, UA 100 (A) NEGATIVE mg/dL   Hgb urine dipstick NEGATIVE NEGATIVE   Bilirubin Urine NEGATIVE NEGATIVE   Ketones, ur NEGATIVE NEGATIVE mg/dL   Protein, ur NEGATIVE NEGATIVE mg/dL   Urobilinogen, UA 0.2 0.0 - 1.0 mg/dL   Nitrite NEGATIVE NEGATIVE   Leukocytes, UA TRACE (A) NEGATIVE  Pregnancy, urine     Status: None   Collection Time: 04/10/14 10:34 PM  Result Value Ref Range   Preg Test, Ur NEGATIVE NEGATIVE    Comment:        THE SENSITIVITY OF THIS METHODOLOGY IS >20 mIU/mL.   Urine microscopic-add on     Status: Abnormal   Collection Time: 04/10/14 10:34 PM  Result Value Ref Range   Squamous Epithelial / LPF RARE RARE   WBC, UA 0-2 <3 WBC/hpf   RBC / HPF 0-2 <3 RBC/hpf   Bacteria, UA FEW (A) RARE  CBC     Status: Abnormal   Collection Time: 04/30/14  2:33 PM  Result Value Ref Range   WBC 6.9 4.0 - 10.5 K/uL   RBC 4.86 3.87 - 5.11 Mil/uL   Platelets 205.0 150.0 - 400.0 K/uL   Hemoglobin 15.6 (H) 12.0 - 15.0 g/dL   HCT 44.0 36.0 - 46.0 %   MCV 90.5 78.0 - 100.0 fl   MCHC 35.6 30.0 - 36.0 g/dL   RDW 12.4 11.5 - 15.5 %  Comp Met (CMET)     Status: Abnormal   Collection Time: 04/30/14  2:33 PM  Result Value Ref Range   Sodium 136 135 - 145 mEq/L   Potassium 4.0 3.5 - 5.1 mEq/L   Chloride 104 96 - 112 mEq/L   CO2 22 19 - 32 mEq/L   Glucose, Bld 165 (H) 70 - 99 mg/dL   BUN 19 6 - 23 mg/dL   Creatinine, Ser 0.66 0.40 - 1.20 mg/dL   Total Bilirubin 0.8 0.2 - 1.2 mg/dL   Alkaline Phosphatase 51 39 - 117 U/L   AST 34 0 - 37 U/L   ALT 51 (H) 0 - 35 U/L    Total Protein 7.1 6.0 - 8.3 g/dL   Albumin 4.4 3.5 - 5.2 g/dL   Calcium 9.5 8.4 - 10.5 mg/dL   GFR  114.83 >60.00 mL/min  B12     Status: None   Collection Time: 04/30/14  2:33 PM  Result Value Ref Range   Vitamin B-12 411 211 - 911 pg/mL  Hemoglobin A1c     Status: Abnormal   Collection Time: 04/30/14  2:33 PM  Result Value Ref Range   Hgb A1c MFr Bld 7.2 (H) 4.6 - 6.5 %    Comment: Glycemic Control Guidelines for People with Diabetes:Non Diabetic:  <6%Goal of Therapy: <7%Additional Action Suggested:  >8%     Assessment/Plan: Essential hypertension No change in blood pressure with use of Toprol-XL. Patient is no longer trying to conceive. We'll discontinue Toprol and begin lisinopril 10 mg daily. DASH diet discussed. Handout given. Follow-up in one month.

## 2014-07-02 NOTE — Assessment & Plan Note (Signed)
No change in blood pressure with use of Toprol-XL. Patient is no longer trying to conceive. We'll discontinue Toprol and begin lisinopril 10 mg daily. DASH diet discussed. Handout given. Follow-up in one month.

## 2014-07-02 NOTE — Progress Notes (Signed)
Pre visit review using our clinic review tool, if applicable. No additional management support is needed unless otherwise documented below in the visit note/SLS  

## 2014-07-08 ENCOUNTER — Emergency Department (HOSPITAL_BASED_OUTPATIENT_CLINIC_OR_DEPARTMENT_OTHER)
Admission: EM | Admit: 2014-07-08 | Discharge: 2014-07-09 | Disposition: A | Discharge status: Home / Self Care | Payer: 59 | Attending: Emergency Medicine | Admitting: Emergency Medicine

## 2014-07-08 ENCOUNTER — Encounter (HOSPITAL_BASED_OUTPATIENT_CLINIC_OR_DEPARTMENT_OTHER): Payer: Self-pay | Admitting: *Deleted

## 2014-07-08 DIAGNOSIS — R52 Pain, unspecified: Secondary | ICD-10-CM

## 2014-07-08 DIAGNOSIS — Z9889 Other specified postprocedural states: Secondary | ICD-10-CM | POA: Insufficient documentation

## 2014-07-08 DIAGNOSIS — Z9049 Acquired absence of other specified parts of digestive tract: Secondary | ICD-10-CM | POA: Insufficient documentation

## 2014-07-08 DIAGNOSIS — Z3202 Encounter for pregnancy test, result negative: Secondary | ICD-10-CM | POA: Diagnosis not present

## 2014-07-08 DIAGNOSIS — R1030 Lower abdominal pain, unspecified: Secondary | ICD-10-CM | POA: Diagnosis not present

## 2014-07-08 DIAGNOSIS — R110 Nausea: Secondary | ICD-10-CM | POA: Insufficient documentation

## 2014-07-08 DIAGNOSIS — Z79899 Other long term (current) drug therapy: Secondary | ICD-10-CM | POA: Diagnosis not present

## 2014-07-08 DIAGNOSIS — E119 Type 2 diabetes mellitus without complications: Secondary | ICD-10-CM | POA: Diagnosis not present

## 2014-07-08 DIAGNOSIS — R1031 Right lower quadrant pain: Secondary | ICD-10-CM | POA: Diagnosis present

## 2014-07-08 DIAGNOSIS — Z8719 Personal history of other diseases of the digestive system: Secondary | ICD-10-CM | POA: Insufficient documentation

## 2014-07-08 LAB — URINALYSIS, ROUTINE W REFLEX MICROSCOPIC
BILIRUBIN URINE: NEGATIVE
Glucose, UA: NEGATIVE mg/dL
Hgb urine dipstick: NEGATIVE
KETONES UR: NEGATIVE mg/dL
Nitrite: NEGATIVE
Protein, ur: NEGATIVE mg/dL
Specific Gravity, Urine: 1.027 (ref 1.005–1.030)
UROBILINOGEN UA: 1 mg/dL (ref 0.0–1.0)
pH: 6.5 (ref 5.0–8.0)

## 2014-07-08 LAB — CBC WITH DIFFERENTIAL/PLATELET
Basophils Absolute: 0 10*3/uL (ref 0.0–0.1)
Basophils Relative: 0 % (ref 0–1)
EOS PCT: 2 % (ref 0–5)
Eosinophils Absolute: 0.2 10*3/uL (ref 0.0–0.7)
HCT: 42.5 % (ref 36.0–46.0)
HEMOGLOBIN: 15.4 g/dL — AB (ref 12.0–15.0)
Lymphocytes Relative: 39 % (ref 12–46)
Lymphs Abs: 3.2 10*3/uL (ref 0.7–4.0)
MCH: 32.5 pg (ref 26.0–34.0)
MCHC: 36.2 g/dL — ABNORMAL HIGH (ref 30.0–36.0)
MCV: 89.7 fL (ref 78.0–100.0)
MONO ABS: 0.7 10*3/uL (ref 0.1–1.0)
Monocytes Relative: 8 % (ref 3–12)
Neutro Abs: 4.2 10*3/uL (ref 1.7–7.7)
Neutrophils Relative %: 51 % (ref 43–77)
Platelets: 220 10*3/uL (ref 150–400)
RBC: 4.74 MIL/uL (ref 3.87–5.11)
RDW: 12.4 % (ref 11.5–15.5)
WBC: 8.2 10*3/uL (ref 4.0–10.5)

## 2014-07-08 LAB — URINE MICROSCOPIC-ADD ON

## 2014-07-08 LAB — PREGNANCY, URINE: PREG TEST UR: NEGATIVE

## 2014-07-08 MED ORDER — DIPHENHYDRAMINE HCL 50 MG/ML IJ SOLN
12.5000 mg | Freq: Once | INTRAMUSCULAR | Status: AC
  Administered 2014-07-09: 12.5 mg via INTRAVENOUS
  Filled 2014-07-08: qty 1

## 2014-07-08 MED ORDER — ONDANSETRON HCL 4 MG/2ML IJ SOLN
4.0000 mg | Freq: Once | INTRAMUSCULAR | Status: AC
  Administered 2014-07-09: 4 mg via INTRAVENOUS
  Filled 2014-07-08: qty 2

## 2014-07-08 MED ORDER — MORPHINE SULFATE 4 MG/ML IJ SOLN
4.0000 mg | Freq: Once | INTRAMUSCULAR | Status: AC
Start: 2014-07-09 — End: 2014-07-09
  Administered 2014-07-09: 4 mg via INTRAVENOUS
  Filled 2014-07-08: qty 1

## 2014-07-08 NOTE — ED Provider Notes (Signed)
CSN: [id number]     Arrival date & time 07/08/14  2324 History  This chart was scribed for Veronica Raburn, MD by Modena Jansky, ED Scribe. This patient was seen in room MH07/MH07 and the patient's care was started at 11:49 PM.   Chief Complaint  Patient presents with  . Abdominal Pain   Patient is a 29 y.o. female presenting with abdominal pain. The history is provided by the patient. No language interpreter was used.  Abdominal Pain Pain location:  RUQ and RLQ Pain quality: sharp   Pain radiates to:  Does not radiate Pain severity:  Severe Onset quality:  Gradual Timing:  Constant Progression:  Worsening Chronicity:  New Context: not trauma   Relieved by:  Nothing Worsened by:  Nothing tried Ineffective treatments: norco. Associated symptoms: nausea   Associated symptoms: no constipation, no cough, no dysuria, no fever, no sore throat, no vaginal bleeding, no vaginal discharge and no vomiting   Risk factors: not pregnant    HPI Comments: Veronica Mclaughlin is a 29 y.o. female who presents to the Emergency Department complaining of intermittent moderate right sided abdominal pain that started this morning. She states that she started having right sided abdominal pain while getting ready for work this morning. She reports that the pain worsened and radiated to her periumbilical area with nausea throughout the day. She currently rates the pain as a 9/10. She describes the pain as a sharp and burning sensation. She states that she had a normal BM about an hour ago. She reports a hx of abdominal surgeries. She denies any sick contacts. She also denies any fever, rash, cough, congestion, sore throat, constipation, vomiting, dysuria, vaginal bleeding, or discharge.   Past Medical History  Diagnosis Date  . PCOS (polycystic ovarian syndrome)   . Gallstones   . Diabetes mellitus type II, controlled    Past Surgical History  Procedure Laterality Date  . Cholecystectomy  2014  . Colonoscopy  2005    Family History  Problem Relation Age of Onset  . Diabetes Mother     Living  . Hypertension Mother   . Uterine cancer Mother     Pre-cancerous  . Thyroid disease Mother   . Diabetes type II Father     Living  . Hypertension Father   . Gout Father   . Neuropathy Father   . Breast cancer Maternal Grandmother   . Heart attack Paternal Grandfather   . Colon cancer Paternal Grandfather   . Colon cancer Maternal Grandfather   . Heart defect Brother   . Prostate cancer Maternal Uncle   . Alcoholism Other     Paternal Side  . Diabetes Maternal Aunt   . Diabetes Maternal Uncle    History  Substance Use Topics  . Smoking status: Never Smoker   . Smokeless tobacco: Never Used  . Alcohol Use: 0.0 oz/week    0 Standard drinks or equivalent per week     Comment: 1 glass of wine a month   OB History    No data available     Review of Systems  Constitutional: Negative for fever.  HENT: Negative for congestion and sore throat.   Respiratory: Negative for cough.   Gastrointestinal: Positive for nausea and abdominal pain. Negative for vomiting and constipation.  Genitourinary: Negative for dysuria, vaginal bleeding and vaginal discharge.  Skin: Negative for rash.  All other systems reviewed and are negative.   Allergies  Plan b; Progesterone; Mobic; and Pineapple  Home Medications  Prior to Admission medications   Medication Sig Start Date End Date Taking? Authorizing Provider  EPINEPHrine 0.3 mg/0.3 mL IJ SOAJ injection Inject 0.3 mLs (0.3 mg total) into the muscle once. 05/31/14   Waldon Merl, PA-C  gabapentin (NEURONTIN) 100 MG capsule Take 1 capsule (100 mg total) by mouth 2 (two) times daily. Patient taking differently: Take 100 mg by mouth daily.  04/30/14   Waldon Merl, PA-C  HYDROcodone-acetaminophen (NORCO/VICODIN) 5-325 MG per tablet Take 1 tablet by mouth every 4 (four) hours as needed for moderate pain.    Historical Provider, MD  lidocaine (XYLOCAINE) 5 %  ointment Apply 1 application topically at bedtime as needed. 05/21/14   Donika K Patel, DO  lisinopril (PRINIVIL,ZESTRIL) 10 MG tablet Take 1 tablet (10 mg total) by mouth daily. 07/02/14   Waldon Merl, PA-C  metFORMIN (GLUCOPHAGE-XR) 500 MG 24 hr tablet Take 1 tablet (500 mg total) by mouth daily with breakfast. 05/31/14   Waldon Merl, PA-C  Multiple Vitamin (MULTIVITAMIN) capsule Take 1 capsule by mouth daily. Womens Reproductive Health with Folic Acid    Historical Provider, MD  traZODone (DESYREL) 50 MG tablet Take 50 mg by mouth at bedtime as needed for sleep.    Historical Provider, MD   BP 155/102 mmHg  Pulse 92  Temp(Src) 98.2 F (36.8 C) (Oral)  Resp 99  Ht 5\' 8"  (1.727 m)  Wt 243 lb (110.224 kg)  BMI 36.96 kg/m2  SpO2 100%  LMP 06/21/2014 (Approximate) Physical Exam  Constitutional: She is oriented to person, place, and time. She appears well-developed and well-nourished. No distress.  HENT:  Head: Normocephalic and atraumatic.  Mouth/Throat: Oropharynx is clear and moist.  Eyes: Pupils are equal, round, and reactive to light.  Neck: Normal range of motion. Neck supple. No tracheal deviation present.  Cardiovascular: Normal rate and regular rhythm.  Exam reveals no gallop and no friction rub.   No murmur heard. Pulmonary/Chest: Effort normal. No respiratory distress. She has no wheezes. She has no rales.  Abdominal: Soft. Bowel sounds are normal. She exhibits no distension and no mass. There is tenderness. There is guarding. There is no rebound.  Musculoskeletal: Normal range of motion.  Lymphadenopathy:    She has no cervical adenopathy.  Neurological: She is alert and oriented to person, place, and time.  Skin: Skin is warm and dry.  Psychiatric: She has a normal mood and affect. Her behavior is normal.  Nursing note and vitals reviewed.   ED Course  Procedures (including critical care time) DIAGNOSTIC STUDIES: Oxygen Saturation is 100% on RA, normal by my  interpretation.    COORDINATION OF CARE: 11:53 PM- Pt advised of plan for treatment which includes medication, radiology, and labs and pt agrees.  Labs Review Labs Reviewed  PREGNANCY, URINE  URINALYSIS, ROUTINE W REFLEX MICROSCOPIC  CBC WITH DIFFERENTIAL/PLATELET  COMPREHENSIVE METABOLIC PANEL    Imaging Review No results found.   EKG Interpretation None      MDM   Final diagnoses:  None    Results for orders placed or performed during the hospital encounter of 07/08/14  Pregnancy, urine  Result Value Ref Range   Preg Test, Ur NEGATIVE NEGATIVE  Urinalysis, Routine w reflex microscopic  Result Value Ref Range   Color, Urine YELLOW YELLOW   APPearance CLEAR CLEAR   Specific Gravity, Urine 1.027 1.005 - 1.030   pH 6.5 5.0 - 8.0   Glucose, UA NEGATIVE NEGATIVE mg/dL   Hgb urine dipstick  NEGATIVE NEGATIVE   Bilirubin Urine NEGATIVE NEGATIVE   Ketones, ur NEGATIVE NEGATIVE mg/dL   Protein, ur NEGATIVE NEGATIVE mg/dL   Urobilinogen, UA 1.0 0.0 - 1.0 mg/dL   Nitrite NEGATIVE NEGATIVE   Leukocytes, UA SMALL (A) NEGATIVE  CBC with Differential  Result Value Ref Range   WBC 8.2 4.0 - 10.5 K/uL   RBC 4.74 3.87 - 5.11 MIL/uL   Hemoglobin 15.4 (H) 12.0 - 15.0 g/dL   HCT 16.1 09.6 - 04.5 %   MCV 89.7 78.0 - 100.0 fL   MCH 32.5 26.0 - 34.0 pg   MCHC 36.2 (H) 30.0 - 36.0 g/dL   RDW 40.9 81.1 - 91.4 %   Platelets 220 150 - 400 K/uL   Neutrophils Relative % 51 43 - 77 %   Neutro Abs 4.2 1.7 - 7.7 K/uL   Lymphocytes Relative 39 12 - 46 %   Lymphs Abs 3.2 0.7 - 4.0 K/uL   Monocytes Relative 8 3 - 12 %   Monocytes Absolute 0.7 0.1 - 1.0 K/uL   Eosinophils Relative 2 0 - 5 %   Eosinophils Absolute 0.2 0.0 - 0.7 K/uL   Basophils Relative 0 0 - 1 %   Basophils Absolute 0.0 0.0 - 0.1 K/uL  Comprehensive metabolic panel  Result Value Ref Range   Sodium 137 135 - 145 mmol/L   Potassium 3.6 3.5 - 5.1 mmol/L   Chloride 101 96 - 112 mmol/L   CO2 27 19 - 32 mmol/L   Glucose,  Bld 156 (H) 70 - 99 mg/dL   BUN 18 6 - 23 mg/dL   Creatinine, Ser 7.82 0.50 - 1.10 mg/dL   Calcium 9.6 8.4 - 95.6 mg/dL   Total Protein 7.8 6.0 - 8.3 g/dL   Albumin 4.8 3.5 - 5.2 g/dL   AST 35 0 - 37 U/L   ALT 58 (H) 0 - 35 U/L   Alkaline Phosphatase 53 39 - 117 U/L   Total Bilirubin 0.7 0.3 - 1.2 mg/dL   GFR calc non Af Amer >90 >90 mL/min   GFR calc Af Amer >90 >90 mL/min   Anion gap 9 5 - 15  Urine microscopic-add on  Result Value Ref Range   Squamous Epithelial / LPF MANY (A) RARE   WBC, UA 3-6 <3 WBC/hpf   RBC / HPF 0-2 <3 RBC/hpf   Bacteria, UA MANY (A) RARE   Urine-Other MUCOUS PRESENT   Lipase, blood  Result Value Ref Range   Lipase 33 11 - 59 U/L   Ct Abdomen Pelvis W Contrast  07/09/2014   CLINICAL DATA:  Right-sided abdominal pain for 1 day.  Nausea.  EXAM: CT ABDOMEN AND PELVIS WITH CONTRAST  TECHNIQUE: Multidetector CT imaging of the abdomen and pelvis was performed using the standard protocol following bolus administration of intravenous contrast.  CONTRAST:  25mL OMNIPAQUE IOHEXOL 300 MG/ML SOLN, OMNIPAQUE IOHEXOL 300 MG/ML SOLN  COMPARISON:  Pelvic ultrasound 04/10/2014  FINDINGS: The included lung bases are clear.  Elongated left hepatic lobe, diffuse hepatic steatosis. No focal hepatic lesion. Postcholecystectomy clips in the gallbladder fossa. No biliary dilatation. Spleen at the upper limits of normal in size measuring 13 cm. Pancreas, adrenal glands, and kidneys are normal.  The stomach is physiologically distended. There are no dilated or thickened bowel loops. The appendix is normal. Small volume of colonic stool without colonic wall thickening. No free air, free fluid, or intra-abdominal fluid collection.  Abdominal aorta is normal in caliber. No  retroperitoneal adenopathy.  Within the pelvis the urinary bladder is physiologically distended. Uterus is normal for age. The ovaries appear symmetric in size without adnexal mass. No pelvic free fluid. No pelvic  adenopathy.  There are no acute or suspicious osseous abnormalities. Mild sclerosis about the sacroiliac joints. Limbic vertebra noted at L3.  IMPRESSION: 1. No acute abnormality in the abdomen/pelvis. 2. Hepatic steatosis.  Borderline hepatosplenomegaly.   Electronically Signed   By: Rubye OaksMelanie  Ehinger M.D.   On: 07/09/2014 02:21    Medications  morphine 4 MG/ML injection 4 mg (4 mg Intravenous Given 07/09/14 0041)  diphenhydrAMINE (BENADRYL) injection 12.5 mg (12.5 mg Intravenous Given 07/09/14 0042)  ondansetron (ZOFRAN) injection 4 mg (4 mg Intravenous Given 07/09/14 0038)  iohexol (OMNIPAQUE) 300 MG/ML solution 25 mL (25 mLs Oral Contrast Given 07/09/14 0101)  iohexol (OMNIPAQUE) 300 MG/ML solution 100 mL (100 mLs Intravenous Contrast Given 07/09/14 0101)  morphine 4 MG/ML injection 4 mg (4 mg Intravenous Given 07/09/14 0205)  ketorolac (TORADOL) 30 MG/ML injection 30 mg (30 mg Intravenous Given 07/09/14 0248)   Markedly improved post medication.  Labs and CT are normal.  Follow up with your PMD and GI  Exam now benign post medication  I personally performed the services described in this documentation, which was scribed in my presence. The recorded information has been reviewed and is accurate.     Cy BlamerApril Hind Chesler, MD 07/09/14 [id number]

## 2014-07-08 NOTE — ED Notes (Signed)
Patient states that she is having severe pain to her right side near her belly button. No vomiting, however feels like she could. The patient reports that the pain has been intermittent since this am. Also took one 25 benadryl and 1 vicodin

## 2014-07-09 ENCOUNTER — Encounter (HOSPITAL_BASED_OUTPATIENT_CLINIC_OR_DEPARTMENT_OTHER): Payer: Self-pay | Admitting: Emergency Medicine

## 2014-07-09 ENCOUNTER — Emergency Department (HOSPITAL_BASED_OUTPATIENT_CLINIC_OR_DEPARTMENT_OTHER): Payer: 59

## 2014-07-09 LAB — COMPREHENSIVE METABOLIC PANEL
ALT: 58 U/L — AB (ref 0–35)
AST: 35 U/L (ref 0–37)
Albumin: 4.8 g/dL (ref 3.5–5.2)
Alkaline Phosphatase: 53 U/L (ref 39–117)
Anion gap: 9 (ref 5–15)
BILIRUBIN TOTAL: 0.7 mg/dL (ref 0.3–1.2)
BUN: 18 mg/dL (ref 6–23)
CALCIUM: 9.6 mg/dL (ref 8.4–10.5)
CHLORIDE: 101 mmol/L (ref 96–112)
CO2: 27 mmol/L (ref 19–32)
CREATININE: 0.71 mg/dL (ref 0.50–1.10)
GLUCOSE: 156 mg/dL — AB (ref 70–99)
Potassium: 3.6 mmol/L (ref 3.5–5.1)
Sodium: 137 mmol/L (ref 135–145)
Total Protein: 7.8 g/dL (ref 6.0–8.3)

## 2014-07-09 LAB — LIPASE, BLOOD: Lipase: 33 U/L (ref 11–59)

## 2014-07-09 MED ORDER — IOHEXOL 300 MG/ML  SOLN
100.0000 mL | Freq: Once | INTRAMUSCULAR | Status: AC | PRN
  Administered 2014-07-09: 100 mL via INTRAVENOUS

## 2014-07-09 MED ORDER — IOHEXOL 300 MG/ML  SOLN
25.0000 mL | Freq: Once | INTRAMUSCULAR | Status: AC | PRN
  Administered 2014-07-09: 25 mL via ORAL

## 2014-07-09 MED ORDER — KETOROLAC TROMETHAMINE 30 MG/ML IJ SOLN
INTRAMUSCULAR | Status: AC
  Filled 2014-07-09: qty 1

## 2014-07-09 MED ORDER — ONDANSETRON 8 MG PO TBDP: ORAL_TABLET | ORAL | Status: DC

## 2014-07-09 MED ORDER — HYDROCODONE-ACETAMINOPHEN 5-325 MG PO TABS: 2.0000 | ORAL_TABLET | ORAL | Status: DC | PRN

## 2014-07-09 MED ORDER — MORPHINE SULFATE 4 MG/ML IJ SOLN
4.0000 mg | Freq: Once | INTRAMUSCULAR | Status: AC
Start: 2014-07-09 — End: 2014-07-09
  Administered 2014-07-09: 4 mg via INTRAVENOUS
  Filled 2014-07-09: qty 1

## 2014-07-09 MED ORDER — KETOROLAC TROMETHAMINE 30 MG/ML IJ SOLN
30.0000 mg | Freq: Once | INTRAMUSCULAR | Status: AC
  Administered 2014-07-09: 30 mg via INTRAVENOUS

## 2014-07-29 ENCOUNTER — Encounter: Payer: Self-pay | Admitting: Physician Assistant

## 2014-08-04 ENCOUNTER — Ambulatory Visit (INDEPENDENT_AMBULATORY_CARE_PROVIDER_SITE_OTHER): Payer: 59 | Admitting: Physician Assistant

## 2014-08-04 ENCOUNTER — Encounter: Payer: Self-pay | Admitting: Physician Assistant

## 2014-08-04 VITALS — BP 140/80 | HR 91 | Temp 98.0°F | Wt 242.0 lb

## 2014-08-04 DIAGNOSIS — N926 Irregular menstruation, unspecified: Secondary | ICD-10-CM

## 2014-08-04 LAB — POCT URINE PREGNANCY: Preg Test, Ur: NEGATIVE

## 2014-08-04 NOTE — Assessment & Plan Note (Signed)
In patient with PCOS. Urine pregnancy negative.  Will check serum pregnancy level today.  Low concern for pregnancy giving her PCOS and husband's fertility issues.  Instructed her to follow-up with OB/GYN if test negative but symptoms continue.

## 2014-08-04 NOTE — Addendum Note (Signed)
Addended by: Marcelline MatesMARTIN, Joanann Mies on: 08/04/2014 11:20 AM   Modules accepted: Orders

## 2014-08-04 NOTE — Patient Instructions (Signed)
Please go to lab. We will restart Lisinopril at 5 mg if test negative. Follow-up with your gynecologist.

## 2014-08-04 NOTE — Progress Notes (Signed)
Pre visit review using our clinic review tool, if applicable. No additional management support is needed unless otherwise documented below in the visit note. 

## 2014-08-04 NOTE — Progress Notes (Signed)
 Patient with history of PCOS presents to clinic complaining of being 13 days late for period.  Has noticed some breast sensitivity.  Is sexually active with her husband.  Endorses home pregnancy test negative. Is requesting blood testing to confirm.  Past Medical History  Diagnosis Date  . PCOS (polycystic ovarian syndrome)   . Gallstones   . Diabetes mellitus type II, controlled     Current Outpatient Prescriptions on File Prior to Visit  Medication Sig Dispense Refill  . EPINEPHrine 0.3 mg/0.3 mL IJ SOAJ injection Inject 0.3 mLs (0.3 mg total) into the muscle once. 1 Device 0  . gabapentin (NEURONTIN) 100 MG capsule Take 1 capsule (100 mg total) by mouth 2 (two) times daily. (Patient taking differently: Take 100 mg by mouth daily. ) 60 capsule 3  . HYDROcodone-acetaminophen (NORCO) 5-325 MG per tablet Take 2 tablets by mouth every 4 (four) hours as needed. 10 tablet 0  . HYDROcodone-acetaminophen (NORCO/VICODIN) 5-325 MG per tablet Take 1 tablet by mouth every 4 (four) hours as needed for moderate pain.    . lidocaine (XYLOCAINE) 5 % ointment Apply 1 application topically at bedtime as needed. 35.44 g 3  . lisinopril (PRINIVIL,ZESTRIL) 10 MG tablet Take 1 tablet (10 mg total) by mouth daily. 90 tablet 1  . metFORMIN (GLUCOPHAGE-XR) 500 MG 24 hr tablet Take 1 tablet (500 mg total) by mouth daily with breakfast. 30 tablet 0  . Multiple Vitamin (MULTIVITAMIN) capsule Take 1 capsule by mouth daily. Womens Reproductive Health with Folic Acid    . ondansetron (ZOFRAN ODT) 8 MG disintegrating tablet 8mg ODT q8 hours prn nausea 12 tablet 0  . traZODone (DESYREL) 50 MG tablet Take 50 mg by mouth at bedtime as needed for sleep.     No current facility-administered medications on file prior to visit.    Allergies  Allergen Reactions  . Plan B [Levonorgestrel] Anaphylaxis  . Progesterone Anaphylaxis  . Mobic [Meloxicam] Rash  . Pineapple Palpitations and Rash    Family History  Problem  Relation Age of Onset  . Diabetes Mother     Living  . Hypertension Mother   . Uterine cancer Mother     Pre-cancerous  . Thyroid disease Mother   . Diabetes type II Father     Living  . Hypertension Father   . Gout Father   . Neuropathy Father   . Breast cancer Maternal Grandmother   . Heart attack Paternal Grandfather   . Colon cancer Paternal Grandfather   . Colon cancer Maternal Grandfather   . Heart defect Brother   . Prostate cancer Maternal Uncle   . Alcoholism Other     Paternal Side  . Diabetes Maternal Aunt   . Diabetes Maternal Uncle     History   Social History  . Marital Status: Married    Spouse Name: N/A  . Number of Children: N/A  . Years of Education: N/A   Social History Main Topics  . Smoking status: Never Smoker   . Smokeless tobacco: Never Used  . Alcohol Use: 0.0 oz/week    0 Standard drinks or equivalent per week     Comment: 1 glass of wine a month  . Drug Use: No  . Sexual Activity:    Partners: Male   Other Topics Concern  . None   Social History Narrative   Lives with husband in a one story home.  No children.     Works as an EMT.       Education: associates degree.   Review of Systems - See HPI.  All other ROS are negative.  BP 140/80 mmHg  Pulse 91  Temp(Src) 98 F (36.7 C)  Wt 242 lb (109.77 kg)  SpO2 99%  LMP 06/21/2014 (Approximate)  Physical Exam  Constitutional: She is oriented to person, place, and time and well-developed, well-nourished, and in no distress.  HENT:  Head: Normocephalic and atraumatic.  Eyes: Conjunctivae are normal.  Cardiovascular: Normal rate, regular rhythm, normal heart sounds and intact distal pulses.   Pulmonary/Chest: Effort normal and breath sounds normal. No respiratory distress. She has no wheezes. She has no rales. She exhibits no tenderness.  Abdominal: Soft. Bowel sounds are normal. She exhibits no distension and no mass. There is no tenderness. There is no rebound and no guarding.    Neurological: She is alert and oriented to person, place, and time.  Skin: Skin is warm and dry. No rash noted.  Vitals reviewed.   Recent Results (from the past 2160 hour(s))  Pregnancy, urine     Status: None   Collection Time: 07/08/14 11:37 PM  Result Value Ref Range   Preg Test, Ur NEGATIVE NEGATIVE    Comment:        THE SENSITIVITY OF THIS METHODOLOGY IS >20 mIU/mL.   Urinalysis, Routine w reflex microscopic     Status: Abnormal   Collection Time: 07/08/14 11:37 PM  Result Value Ref Range   Color, Urine YELLOW YELLOW   APPearance CLEAR CLEAR   Specific Gravity, Urine 1.027 1.005 - 1.030   pH 6.5 5.0 - 8.0   Glucose, UA NEGATIVE NEGATIVE mg/dL   Hgb urine dipstick NEGATIVE NEGATIVE   Bilirubin Urine NEGATIVE NEGATIVE   Ketones, ur NEGATIVE NEGATIVE mg/dL   Protein, ur NEGATIVE NEGATIVE mg/dL   Urobilinogen, UA 1.0 0.0 - 1.0 mg/dL   Nitrite NEGATIVE NEGATIVE   Leukocytes, UA SMALL (A) NEGATIVE  Urine microscopic-add on     Status: Abnormal   Collection Time: 07/08/14 11:37 PM  Result Value Ref Range   Squamous Epithelial / LPF MANY (A) RARE   WBC, UA 3-6 <3 WBC/hpf   RBC / HPF 0-2 <3 RBC/hpf   Bacteria, UA MANY (A) RARE   Urine-Other MUCOUS PRESENT   Lipase, blood     Status: None   Collection Time: 07/08/14 11:44 PM  Result Value Ref Range   Lipase 33 11 - 59 U/L  CBC with Differential     Status: Abnormal   Collection Time: 07/08/14 11:50 PM  Result Value Ref Range   WBC 8.2 4.0 - 10.5 K/uL   RBC 4.74 3.87 - 5.11 MIL/uL   Hemoglobin 15.4 (H) 12.0 - 15.0 g/dL   HCT 42.5 36.0 - 46.0 %   MCV 89.7 78.0 - 100.0 fL   MCH 32.5 26.0 - 34.0 pg   MCHC 36.2 (H) 30.0 - 36.0 g/dL   RDW 12.4 11.5 - 15.5 %   Platelets 220 150 - 400 K/uL   Neutrophils Relative % 51 43 - 77 %   Neutro Abs 4.2 1.7 - 7.7 K/uL   Lymphocytes Relative 39 12 - 46 %   Lymphs Abs 3.2 0.7 - 4.0 K/uL   Monocytes Relative 8 3 - 12 %   Monocytes Absolute 0.7 0.1 - 1.0 K/uL   Eosinophils  Relative 2 0 - 5 %   Eosinophils Absolute 0.2 0.0 - 0.7 K/uL   Basophils Relative 0 0 - 1 %   Basophils Absolute 0.0 0.0 -   0.1 K/uL  Comprehensive metabolic panel     Status: Abnormal   Collection Time: 07/08/14 11:50 PM  Result Value Ref Range   Sodium 137 135 - 145 mmol/L   Potassium 3.6 3.5 - 5.1 mmol/L   Chloride 101 96 - 112 mmol/L   CO2 27 19 - 32 mmol/L   Glucose, Bld 156 (H) 70 - 99 mg/dL   BUN 18 6 - 23 mg/dL   Creatinine, Ser 0.71 0.50 - 1.10 mg/dL   Calcium 9.6 8.4 - 10.5 mg/dL   Total Protein 7.8 6.0 - 8.3 g/dL   Albumin 4.8 3.5 - 5.2 g/dL   AST 35 0 - 37 U/L   ALT 58 (H) 0 - 35 U/L   Alkaline Phosphatase 53 39 - 117 U/L   Total Bilirubin 0.7 0.3 - 1.2 mg/dL   GFR calc non Af Amer >90 >90 mL/min   GFR calc Af Amer >90 >90 mL/min    Comment: (NOTE) The eGFR has been calculated using the CKD EPI equation. This calculation has not been validated in all clinical situations. eGFR's persistently <90 mL/min signify possible Chronic Kidney Disease.    Anion gap 9 5 - 15    Assessment/Plan: Missed period In patient with PCOS. Urine pregnancy negative.  Will check serum pregnancy level today.  Low concern for pregnancy giving her PCOS and husband's fertility issues.  Instructed her to follow-up with OB/GYN if test negative but symptoms continue.       

## 2014-08-05 LAB — HCG, SERUM, QUALITATIVE: Preg, Serum: NEGATIVE

## 2014-09-01 ENCOUNTER — Encounter: Payer: Self-pay | Admitting: Physician Assistant

## 2014-09-01 ENCOUNTER — Ambulatory Visit (INDEPENDENT_AMBULATORY_CARE_PROVIDER_SITE_OTHER): Payer: 59 | Admitting: Physician Assistant

## 2014-09-01 VITALS — BP 131/88 | HR 96 | Temp 98.4°F | Resp 16 | Ht 68.0 in | Wt 244.0 lb

## 2014-09-01 DIAGNOSIS — E119 Type 2 diabetes mellitus without complications: Secondary | ICD-10-CM

## 2014-09-01 DIAGNOSIS — I10 Essential (primary) hypertension: Secondary | ICD-10-CM

## 2014-09-01 MED ORDER — RAMIPRIL 2.5 MG PO CAPS: 2.5000 mg | ORAL_CAPSULE | Freq: Every day | ORAL | Status: DC

## 2014-09-01 MED ORDER — ONDANSETRON 8 MG PO TBDP: ORAL_TABLET | ORAL | Status: DC

## 2014-09-01 MED ORDER — HYDROCODONE-ACETAMINOPHEN 5-325 MG PO TABS: 1.0000 | ORAL_TABLET | ORAL | Status: DC | PRN

## 2014-09-01 NOTE — Progress Notes (Signed)
Pre visit review using our clinic review tool, if applicable. No additional management support is needed unless otherwise documented below in the visit note/SLS  

## 2014-09-01 NOTE — Patient Instructions (Signed)
Stop the lisinopril. Start the ramipril daily as directed. Resume your Metformin. Come back to lab for your A1C check.  Follow-up with GI as scheduled.

## 2014-09-01 NOTE — Assessment & Plan Note (Signed)
Resume Metformin daily as directed.  Dietary and exercise measures reviewed again and encouraged. Will stop lisinopril and begin ramipril 2.5 mg daily. Will repeat A1C today.  Follow-up will be based on results.

## 2014-09-01 NOTE — Progress Notes (Signed)
Patient presents to clinic today for follow-up of DM II, previously controlled. Endorses only taking Metformin sometimes.  Has not been eating well recently.  Denies exercise regimen.  Patient still followed by Gynecology for her PCOS.   Past Medical History  Diagnosis Date  . PCOS (polycystic ovarian syndrome)   . Gallstones   . Diabetes mellitus type II, controlled     Current Outpatient Prescriptions on File Prior to Visit  Medication Sig Dispense Refill  . EPINEPHrine 0.3 mg/0.3 mL IJ SOAJ injection Inject 0.3 mLs (0.3 mg total) into the muscle once. 1 Device 0  . gabapentin (NEURONTIN) 100 MG capsule Take 1 capsule (100 mg total) by mouth 2 (two) times daily. (Patient taking differently: Take 100 mg by mouth daily. ) 60 capsule 3  . lidocaine (XYLOCAINE) 5 % ointment Apply 1 application topically at bedtime as needed. 35.44 g 3  . metFORMIN (GLUCOPHAGE-XR) 500 MG 24 hr tablet Take 1 tablet (500 mg total) by mouth daily with breakfast. 30 tablet 0  . Multiple Vitamin (MULTIVITAMIN) capsule Take 1 capsule by mouth daily. [place] with Folic Acid    . traZODone (DESYREL) 50 MG tablet Take 50 mg by mouth at bedtime as needed for sleep.     No current facility-administered medications on file prior to visit.    Allergies  Allergen Reactions  . Plan B [Levonorgestrel] Anaphylaxis  . Progesterone Anaphylaxis  . Mobic [Meloxicam] Rash  . Pineapple Palpitations and Rash    Family History  Problem Relation Age of Onset  . Diabetes Mother     Living  . Hypertension Mother   . Uterine cancer Mother     Pre-cancerous  . Thyroid disease Mother   . Diabetes type II Father     Living  . Hypertension Father   . Gout Father   . Neuropathy Father   . Breast cancer Maternal Grandmother   . Heart attack Paternal Grandfather   . Colon cancer Paternal Grandfather   . Colon cancer Maternal Grandfather   . Heart defect Brother   . Prostate cancer Maternal Uncle   .  Alcoholism Other     Paternal Side  . Diabetes Maternal Aunt   . Diabetes Maternal Uncle     History   Social History  . Marital Status: Married    Spouse Name: N/A  . Number of Children: N/A  . Years of Education: N/A   Social History Main Topics  . Smoking status: Never Smoker   . Smokeless tobacco: Never Used  . Alcohol Use: 0.0 oz/week    0 Standard drinks or equivalent per week     Comment: 1 glass of wine a month  . Drug Use: No  . Sexual Activity:    Partners: Male   Other Topics Concern  . None   Social History Narrative   Lives with husband in a one story home.  No children.     Works as an Public relations account executive.     Education: associates degree.   Review of Systems - See HPI.  All other ROS are negative.  BP 131/88 mmHg  Pulse 96  Temp(Src) 98.4 F (36.9 C) (Oral)  Resp 16  Ht 5' 8" (1.727 m)  Wt 244 lb (110.678 kg)  BMI 37.11 kg/m2  SpO2 99%  LMP 08/10/2014  Physical Exam  Constitutional: She is well-developed, well-nourished, and in no distress.  HENT:  Head: Normocephalic and atraumatic.  Eyes: Conjunctivae are normal.  Cardiovascular: Normal rate,  regular rhythm, normal heart sounds and intact distal pulses.   Pulmonary/Chest: Effort normal and breath sounds normal. No respiratory distress. She has no wheezes. She has no rales. She exhibits no tenderness.  Neurological: She is alert.  Skin: Skin is warm and dry. No rash noted.  Psychiatric: Affect normal.  Vitals reviewed.   Recent Results (from the past 2160 hour(s))  Pregnancy, urine     Status: None   Collection Time: 07/08/14 11:37 PM  Result Value Ref Range   Preg Test, Ur NEGATIVE NEGATIVE    Comment:        THE SENSITIVITY OF THIS METHODOLOGY IS >20 mIU/mL.   Urinalysis, Routine w reflex microscopic     Status: Abnormal   Collection Time: 07/08/14 11:37 PM  Result Value Ref Range   Color, Urine YELLOW YELLOW   APPearance CLEAR CLEAR   Specific Gravity, Urine 1.027 1.005 - 1.030   pH 6.5 5.0  - 8.0   Glucose, UA NEGATIVE NEGATIVE mg/dL   Hgb urine dipstick NEGATIVE NEGATIVE   Bilirubin Urine NEGATIVE NEGATIVE   Ketones, ur NEGATIVE NEGATIVE mg/dL   Protein, ur NEGATIVE NEGATIVE mg/dL   Urobilinogen, UA 1.0 0.0 - 1.0 mg/dL   Nitrite NEGATIVE NEGATIVE   Leukocytes, UA SMALL (A) NEGATIVE  Urine microscopic-add on     Status: Abnormal   Collection Time: 07/08/14 11:37 PM  Result Value Ref Range   Squamous Epithelial / LPF MANY (A) RARE   WBC, UA 3-6 <3 WBC/hpf   RBC / HPF 0-2 <3 RBC/hpf   Bacteria, UA MANY (A) RARE   Urine-Other MUCOUS PRESENT   Lipase, blood     Status: None   Collection Time: 07/08/14 11:44 PM  Result Value Ref Range   Lipase 33 11 - 59 U/L  CBC with Differential     Status: Abnormal   Collection Time: 07/08/14 11:50 PM  Result Value Ref Range   WBC 8.2 4.0 - 10.5 K/uL   RBC 4.74 3.87 - 5.11 MIL/uL   Hemoglobin 15.4 (H) 12.0 - 15.0 g/dL   HCT 42.5 36.0 - 46.0 %   MCV 89.7 78.0 - 100.0 fL   MCH 32.5 26.0 - 34.0 pg   MCHC 36.2 (H) 30.0 - 36.0 g/dL   RDW 12.4 11.5 - 15.5 %   Platelets 220 150 - 400 K/uL   Neutrophils Relative % 51 43 - 77 %   Neutro Abs 4.2 1.7 - 7.7 K/uL   Lymphocytes Relative 39 12 - 46 %   Lymphs Abs 3.2 0.7 - 4.0 K/uL   Monocytes Relative 8 3 - 12 %   Monocytes Absolute 0.7 0.1 - 1.0 K/uL   Eosinophils Relative 2 0 - 5 %   Eosinophils Absolute 0.2 0.0 - 0.7 K/uL   Basophils Relative 0 0 - 1 %   Basophils Absolute 0.0 0.0 - 0.1 K/uL  Comprehensive metabolic panel     Status: Abnormal   Collection Time: 07/08/14 11:50 PM  Result Value Ref Range   Sodium 137 135 - 145 mmol/L   Potassium 3.6 3.5 - 5.1 mmol/L   Chloride 101 96 - 112 mmol/L   CO2 27 19 - 32 mmol/L   Glucose, Bld 156 (H) 70 - 99 mg/dL   BUN 18 6 - 23 mg/dL   Creatinine, Ser 0.71 0.50 - 1.10 mg/dL   Calcium 9.6 8.4 - 10.5 mg/dL   Total Protein 7.8 6.0 - 8.3 g/dL   Albumin 4.8 3.5 - 5.2 g/dL  AST 35 0 - 37 U/L   ALT 58 (H) 0 - 35 U/L   Alkaline Phosphatase  53 39 - 117 U/L   Total Bilirubin 0.7 0.3 - 1.2 mg/dL   GFR calc non Af Amer >90 >90 mL/min   GFR calc Af Amer >90 >90 mL/min    Comment: (NOTE) The eGFR has been calculated using the CKD EPI equation. This calculation has not been validated in all clinical situations. eGFR's persistently <90 mL/min signify possible Chronic Kidney Disease.    Anion gap 9 5 - 15  hCG, serum, qualitative     Status: None   Collection Time: 08/04/14 11:06 AM  Result Value Ref Range   Preg, Serum NEG   POCT urine pregnancy     Status: Normal   Collection Time: 08/04/14 11:19 AM  Result Value Ref Range   Preg Test, Ur Negative     Assessment/Plan: Diabetes mellitus type II, controlled Resume Metformin daily as directed.  Dietary and exercise measures reviewed again and encouraged. Will stop lisinopril and begin ramipril 2.5 mg daily. Will repeat A1C today.  Follow-up will be based on results.

## 2014-09-03 ENCOUNTER — Other Ambulatory Visit (INDEPENDENT_AMBULATORY_CARE_PROVIDER_SITE_OTHER): Payer: 59

## 2014-09-03 DIAGNOSIS — E1310 Other specified diabetes mellitus with ketoacidosis without coma: Secondary | ICD-10-CM

## 2014-09-03 DIAGNOSIS — E1110 Type 2 diabetes mellitus with ketoacidosis without coma: Secondary | ICD-10-CM

## 2014-09-03 LAB — HEMOGLOBIN A1C: HEMOGLOBIN A1C: 6.6 % — AB (ref 4.6–6.5)

## 2014-09-14 ENCOUNTER — Telehealth: Payer: Self-pay | Admitting: Nurse Practitioner

## 2014-09-14 DIAGNOSIS — J0100 Acute maxillary sinusitis, unspecified: Secondary | ICD-10-CM

## 2014-09-14 NOTE — Progress Notes (Signed)
We are sorry that you are not feeling well.  Here is how we plan to help!  Based on what you have shared with me it looks like you have sinusitis.  Sinusitis is inflammation and infection in the sinus cavities of the head.  Based on your presentation I believe you most likely have Acute Viral Sinusitis. This is an infection most likely caused by a virus.  There is not specific treatment for viral sinusitis other than to help you with the symptoms until the infection runs it's course.  You may use an oral decongestant such as Mucinex D or if you have glaucoma or high blood pressure use plain Mucinex.  Saline nasal spray help and can safely be used as often as needed for congestion, I have prescribed fluticason nasal spray. Spray two sprays in each nostril twice a day to help reduce your symptoms.  Some authorities believe that zinc sprays or the use of Echinacea may shorten the course of your symptoms  Sinus infections are not as easily transmitted as other respiratory infection, however we still recommend that you avoid close contact with loved ones, especially the very young and elderly.  Remember to wash your hands thoroughly throughout the day as this is the number one way to prevent the spread of infection!  Home Care:  Only take medications as instructed by your medical team.  Complete the entire course of an antibiotic.  Do not take these medications with alcohol.  A steam or ultrasonic humidifier can help congestion.  You can place a towel over your head and breathe in the steam from hot water coming from a faucet.  Avoid close contacts especially the very young and the elderly.  Cover your mouth when you cough or sneeze.  Always remember to wash your hands.  Get Help Right Away If:  You develop worsening fever or sinus pain.  You develop a severe head ache or visual changes.  Your symptoms persist after you have completed your treatment plan.  Make sure you  Understand these  instructions.  Will watch your condition.  Will get help right away if you are not doing well or get worse.  Your e-visit answers were reviewed by a board certified advanced clinical practitioner to complete your personal care plan.  Depending on the condition, your plan could have included both over the counter or prescription medications.  If there is a problem please reply  once you have received a response from your provider.  Your safety is important to us.  If you have drug allergies check your prescription carefully.    You can use MyChart to ask questions about today's visit, request a non-urgent call back, or ask for a work or school excuse.  You will get an e-mail in the next two days asking about your experience.  I hope that your e-visit has been valuable and will speed your recovery. Thank you for using e-visits.    

## 2014-09-19 ENCOUNTER — Encounter: Payer: Self-pay | Admitting: Physician Assistant

## 2014-09-22 ENCOUNTER — Encounter: Payer: Self-pay | Admitting: Physician Assistant

## 2014-09-22 ENCOUNTER — Ambulatory Visit (INDEPENDENT_AMBULATORY_CARE_PROVIDER_SITE_OTHER): Payer: 59 | Admitting: Physician Assistant

## 2014-09-22 VITALS — BP 152/104 | HR 88 | Temp 98.1°F | Ht 68.0 in | Wt 245.6 lb

## 2014-09-22 DIAGNOSIS — I10 Essential (primary) hypertension: Secondary | ICD-10-CM | POA: Diagnosis not present

## 2014-09-22 DIAGNOSIS — H6981 Other specified disorders of Eustachian tube, right ear: Secondary | ICD-10-CM | POA: Diagnosis not present

## 2014-09-22 DIAGNOSIS — H6980 Other specified disorders of Eustachian tube, unspecified ear: Secondary | ICD-10-CM | POA: Insufficient documentation

## 2014-09-22 MED ORDER — FLUTICASONE PROPIONATE 50 MCG/ACT NA SUSP: 2.0000 | Freq: Every day | NASAL | Status: DC

## 2014-09-22 MED ORDER — AMLODIPINE BESYLATE 5 MG PO TABS: 5.0000 mg | ORAL_TABLET | Freq: Every day | ORAL | Status: DC

## 2014-09-22 NOTE — Assessment & Plan Note (Signed)
Rx Flonase. Supportive measures reviewed. Follow-up PRN if symptoms are not resolving.

## 2014-09-22 NOTE — Assessment & Plan Note (Signed)
Will stop Ramipril due to side effect.  Patient and husband have decided to attempt to conceive again. Will start amlodipine 5 mg daily. DASH diet encouraged. Will check home BP and send MyChart message with results.  Will likely have to increase to 10 mg daily.

## 2014-09-22 NOTE — Patient Instructions (Signed)
Please stay well hydrated. Use Flonase daily as directed to resolve fluid behind ears.  Start the new blood pressure medication, taking daily as directed. Limit salt intake. 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  Send me a MyChart message with these results.

## 2014-09-22 NOTE — Progress Notes (Signed)
Patient presents to clinic today for medication management for HTN. Unable to tolerate Ramipril due to nausea.  She and her husband have decided to try to have a child. Patient denies chest pain, palpitations, lightheadedness, dizziness, vision changes or frequent headaches.   Past Medical History  Diagnosis Date  . PCOS (polycystic ovarian syndrome)   . Gallstones   . Diabetes mellitus type II, controlled     Current Outpatient Prescriptions on File Prior to Visit  Medication Sig Dispense Refill  . dicyclomine (BENTYL) 10 MG capsule Take 10 mg by mouth 4 (four) times daily -  before meals and at bedtime.    Marland Kitchen EPINEPHrine 0.3 mg/0.3 mL IJ SOAJ injection Inject 0.3 mLs (0.3 mg total) into the muscle once. 1 Device 0  . HYDROcodone-acetaminophen (NORCO/VICODIN) 5-325 MG per tablet Take 1 tablet by mouth every 4 (four) hours as needed for moderate pain. 30 tablet 0  . lidocaine (XYLOCAINE) 5 % ointment Apply 1 application topically at bedtime as needed. 35.44 g 3  . metFORMIN (GLUCOPHAGE-XR) 500 MG 24 hr tablet Take 1 tablet (500 mg total) by mouth daily with breakfast. 30 tablet 0  . Multiple Vitamin (MULTIVITAMIN) capsule Take 1 capsule by mouth daily. [place] with Folic Acid    . ondansetron (ZOFRAN ODT) 8 MG disintegrating tablet 49m ODT q8 hours prn nausea 12 tablet 0  . traZODone (DESYREL) 50 MG tablet Take 50 mg by mouth at bedtime as needed for sleep.     No current facility-administered medications on file prior to visit.    Allergies  Allergen Reactions  . Plan B [Levonorgestrel] Anaphylaxis  . Progesterone Anaphylaxis  . Mobic [Meloxicam] Rash  . Pineapple Palpitations and Rash    Family History  Problem Relation Age of Onset  . Diabetes Mother     Living  . Hypertension Mother   . Uterine cancer Mother     Pre-cancerous  . Thyroid disease Mother   . Diabetes type II Father     Living  . Hypertension Father   . Gout Father   . Neuropathy  Father   . Breast cancer Maternal Grandmother   . Heart attack Paternal Grandfather   . Colon cancer Paternal Grandfather   . Colon cancer Maternal Grandfather   . Heart defect Brother   . Prostate cancer Maternal Uncle   . Alcoholism Other     Paternal Side  . Diabetes Maternal Aunt   . Diabetes Maternal Uncle     History   Social History  . Marital Status: Married    Spouse Name: N/A  . Number of Children: N/A  . Years of Education: N/A   Social History Main Topics  . Smoking status: Never Smoker   . Smokeless tobacco: Never Used  . Alcohol Use: 0.0 oz/week    0 Standard drinks or equivalent per week     Comment: 1 glass of wine a month  . Drug Use: No  . Sexual Activity:    Partners: Male   Other Topics Concern  . None   Social History Narrative   Lives with husband in a one story home.  No children.     Works as an [employer] account executive     Education: associates degree.    Review of Systems - See HPI.  All other ROS are negative.  BP 152/104 mmHg  Pulse 88  Temp(Src) 98.1 F (36.7 C) (Oral)  Ht _0  (1.727 m)  Wt 245 lb 9.6  oz (111.403 kg)  BMI 37.35 kg/m2  SpO2 100%  LMP 09/07/2014  Physical Exam  Constitutional: She is oriented to person, place, and time and well-developed, well-nourished, and in no distress.  HENT:  Head: Normocephalic and atraumatic.  Right Ear: A middle ear effusion is present.  Left Ear: A middle ear effusion is present.  Eyes: Conjunctivae are normal.  Neck: Neck supple.  Cardiovascular: Normal rate, regular rhythm, normal heart sounds and intact distal pulses.   Pulmonary/Chest: Effort normal and breath sounds normal. No respiratory distress. She has no wheezes. She has no rales. She exhibits no tenderness.  Neurological: She is alert and oriented to person, place, and time.  Skin: Skin is warm and dry. No rash noted.  Psychiatric: Affect normal.  Vitals reviewed.   Recent Results (from the past 2160 hour(s))  Pregnancy, urine      Status: None   Collection Time: 07/08/14 11:37 PM  Result Value Ref Range   Preg Test, Ur NEGATIVE NEGATIVE    Comment:        THE SENSITIVITY OF THIS METHODOLOGY IS >20 mIU/mL.   Urinalysis, Routine w reflex microscopic     Status: Abnormal   Collection Time: 07/08/14 11:37 PM  Result Value Ref Range   Color, Urine YELLOW YELLOW   APPearance CLEAR CLEAR   Specific Gravity, Urine 1.027 1.005 - 1.030   pH 6.5 5.0 - 8.0   Glucose, UA NEGATIVE NEGATIVE mg/dL   Hgb urine dipstick NEGATIVE NEGATIVE   Bilirubin Urine NEGATIVE NEGATIVE   Ketones, ur NEGATIVE NEGATIVE mg/dL   Protein, ur NEGATIVE NEGATIVE mg/dL   Urobilinogen, UA 1.0 0.0 - 1.0 mg/dL   Nitrite NEGATIVE NEGATIVE   Leukocytes, UA SMALL (A) NEGATIVE  Urine microscopic-add on     Status: Abnormal   Collection Time: 07/08/14 11:37 PM  Result Value Ref Range   Squamous Epithelial / LPF MANY (A) RARE   WBC, UA 3-6 <3 WBC/hpf   RBC / HPF 0-2 <3 RBC/hpf   Bacteria, UA MANY (A) RARE   Urine-Other MUCOUS PRESENT   Lipase, blood     Status: None   Collection Time: 07/08/14 11:44 PM  Result Value Ref Range   Lipase 33 11 - 59 U/L  CBC with Differential     Status: Abnormal   Collection Time: 07/08/14 11:50 PM  Result Value Ref Range   WBC 8.2 4.0 - 10.5 K/uL   RBC 4.74 3.87 - 5.11 MIL/uL   Hemoglobin 15.4 (H) 12.0 - 15.0 g/dL   HCT 42.5 36.0 - 46.0 %   MCV 89.7 78.0 - 100.0 fL   MCH 32.5 26.0 - 34.0 pg   MCHC 36.2 (H) 30.0 - 36.0 g/dL   RDW 12.4 11.5 - 15.5 %   Platelets 220 150 - 400 K/uL   Neutrophils Relative % 51 43 - 77 %   Neutro Abs 4.2 1.7 - 7.7 K/uL   Lymphocytes Relative 39 12 - 46 %   Lymphs Abs 3.2 0.7 - 4.0 K/uL   Monocytes Relative 8 3 - 12 %   Monocytes Absolute 0.7 0.1 - 1.0 K/uL   Eosinophils Relative 2 0 - 5 %   Eosinophils Absolute 0.2 0.0 - 0.7 K/uL   Basophils Relative 0 0 - 1 %   Basophils Absolute 0.0 0.0 - 0.1 K/uL  Comprehensive metabolic panel     Status: Abnormal   Collection Time:  07/08/14 11:50 PM  Result Value Ref Range   Sodium 137 135 -  145 mmol/L   Potassium 3.6 3.5 - 5.1 mmol/L   Chloride 101 96 - 112 mmol/L   CO2 27 19 - 32 mmol/L   Glucose, Bld 156 (H) 70 - 99 mg/dL   BUN 18 6 - 23 mg/dL   Creatinine, Ser 0.71 0.50 - 1.10 mg/dL   Calcium 9.6 8.4 - 10.5 mg/dL   Total Protein 7.8 6.0 - 8.3 g/dL   Albumin 4.8 3.5 - 5.2 g/dL   AST 35 0 - 37 U/L   ALT 58 (H) 0 - 35 U/L   Alkaline Phosphatase 53 39 - 117 U/L   Total Bilirubin 0.7 0.3 - 1.2 mg/dL   GFR calc non Af Amer >90 >90 mL/min   GFR calc Af Amer >90 >90 mL/min    Comment: (NOTE) The eGFR has been calculated using the CKD EPI equation. This calculation has not been validated in all clinical situations. eGFR's persistently <90 mL/min signify possible Chronic Kidney Disease.    Anion gap 9 5 - 15  hCG, serum, qualitative     Status: None   Collection Time: 08/04/14 11:06 AM  Result Value Ref Range   Preg, Serum NEG   POCT urine pregnancy     Status: Normal   Collection Time: 08/04/14 11:19 AM  Result Value Ref Range   Preg Test, Ur Negative   Hemoglobin A1c     Status: Abnormal   Collection Time: 09/03/14 10:17 AM  Result Value Ref Range   Hgb A1c MFr Bld 6.6 (H) 4.6 - 6.5 %    Comment: Glycemic Control Guidelines for People with Diabetes:Non Diabetic:  <6%Goal of Therapy: <7%Additional Action Suggested:  >8%     Assessment/Plan: Essential hypertension Will stop Ramipril due to side effect.  Patient and husband have decided to attempt to conceive again. Will start amlodipine 5 mg daily. DASH diet encouraged. Will check home BP and send MyChart message with results.  Will likely have to increase to 10 mg daily.  Eustachian tube dysfunction Rx Flonase. Supportive measures reviewed. Follow-up PRN if symptoms are not resolving.

## 2014-09-22 NOTE — Progress Notes (Signed)
Pre visit review using our clinic review tool, if applicable. No additional management support is needed unless otherwise documented below in the visit note. 

## 2014-11-01 ENCOUNTER — Emergency Department
Admission: EM | Admit: 2014-11-01 | Discharge: 2014-11-01 | Disposition: A | Discharge status: Home / Self Care | Payer: 59 | Source: Home / Self Care | Attending: Family Medicine | Admitting: Family Medicine

## 2014-11-01 ENCOUNTER — Encounter: Payer: Self-pay | Admitting: *Deleted

## 2014-11-01 DIAGNOSIS — R358 Other polyuria: Secondary | ICD-10-CM | POA: Diagnosis not present

## 2014-11-01 DIAGNOSIS — R030 Elevated blood-pressure reading, without diagnosis of hypertension: Secondary | ICD-10-CM | POA: Diagnosis not present

## 2014-11-01 DIAGNOSIS — E1165 Type 2 diabetes mellitus with hyperglycemia: Secondary | ICD-10-CM

## 2014-11-01 DIAGNOSIS — IMO0001 Reserved for inherently not codable concepts without codable children: Secondary | ICD-10-CM

## 2014-11-01 DIAGNOSIS — R81 Glycosuria: Secondary | ICD-10-CM

## 2014-11-01 DIAGNOSIS — R3589 Other polyuria: Secondary | ICD-10-CM

## 2014-11-01 LAB — POCT URINALYSIS DIP (MANUAL ENTRY)
Bilirubin, UA: NEGATIVE
Glucose, UA: 250 — AB
Ketones, POC UA: NEGATIVE
Leukocytes, UA: NEGATIVE
Nitrite, UA: NEGATIVE
Protein Ur, POC: NEGATIVE
Spec Grav, UA: >=1.03 (ref 1.005–1.03)
Urobilinogen, UA: 0.2 (ref 0–1)
pH, UA: 5 (ref 5–8)

## 2014-11-01 LAB — POCT URINE PREGNANCY: Preg Test, Ur: NEGATIVE

## 2014-11-01 LAB — POCT FASTING CBG KUC MANUAL ENTRY: POCT Glucose (KUC): 253 mg/dL — AB (ref 70–99)

## 2014-11-01 NOTE — ED Provider Notes (Signed)
CSN: [id number]     Arrival date & time 11/01/14  1225 History   First MD Initiated Contact with Patient 11/01/14 1232     Chief Complaint  Patient presents with  . Polyuria   (Consider location/radiation/quality/duration/timing/severity/associated sxs/prior Treatment) HPI The patient is a 29 year old female with history of PCO S, and controlled type 2 diabetes presenting to urgent care with complaints of polyuria and bladder discomfort for the last 5 days.  Patient states last night she had about 5-10 minutes of severe lower abdominal pain that she describes as "cervical spasm" which resolved on its own.  Patient states she did have a bowel movement during this episode but states the bowel movement did not help with the pain initially.  Patient states she is not having any pain at this time, however, does have some discomfort after she urinates and is going more frequently than usual.  Patient does admit she ate a lot of carbohydrates yesterday and does not check her sugar at home, but knows of no likely be high.  Denies history of DKA.  Denies chest pain or difficulty breathing.  Denies recent weight gain or weight loss.  Denies back pain.  Denies vaginal symptoms.  Past Medical History  Diagnosis Date  . PCOS (polycystic ovarian syndrome)   . Gallstones   . Diabetes mellitus type II, controlled    Past Surgical History  Procedure Laterality Date  . Cholecystectomy  2014  . Colonoscopy  2005   Family History  Problem Relation Age of Onset  . Diabetes Mother     Living  . Hypertension Mother   . Uterine cancer Mother     Pre-cancerous  . Thyroid disease Mother   . Diabetes type II Father     Living  . Hypertension Father   . Gout Father   . Neuropathy Father   . Breast cancer Maternal Grandmother   . Heart attack Paternal Grandfather   . Colon cancer Paternal Grandfather   . Colon cancer Maternal Grandfather   . Heart defect Brother   . Prostate cancer Maternal Uncle   .  Alcoholism Other     Paternal Side  . Diabetes Maternal Aunt   . Diabetes Maternal Uncle    History  Substance Use Topics  . Smoking status: Never Smoker   . Smokeless tobacco: Never Used  . Alcohol Use: 0.0 oz/week    0 Standard drinks or equivalent per week     Comment: 1 glass of wine a month   OB History    No data available     Review of Systems  Constitutional: Negative for fever and chills.  Gastrointestinal: Positive for abdominal pain. Negative for nausea, vomiting, diarrhea and constipation.  Genitourinary: Positive for dysuria, frequency and pelvic pain. Negative for urgency, hematuria, flank pain, decreased urine volume, vaginal bleeding, vaginal discharge, vaginal pain and menstrual problem.  Musculoskeletal: Negative for myalgias and back pain.    Allergies  Plan b; Progesterone; Mobic; and Pineapple  Home Medications   Prior to Admission medications   Medication Sig Start Date End Date Taking? Authorizing Provider  amLODipine (NORVASC) 5 MG tablet Take 1 tablet (5 mg total) by mouth daily. 09/22/14  Yes Waldon Merl, PA-C  gabapentin (NEURONTIN) 100 MG capsule Take 100 mg by mouth 3 (three) times daily.   Yes Historical Provider, MD  HYDROcodone-acetaminophen (NORCO/VICODIN) 5-325 MG per tablet Take 1 tablet by mouth every 4 (four) hours as needed for moderate pain. 09/01/14  Yes Chrissie Noa  Enid Skeens, PA-C  dicyclomine (BENTYL) 10 MG capsule Take 10 mg by mouth 4 (four) times daily -  before meals and at bedtime.    Historical Provider, MD  EPINEPHrine 0.3 mg/0.3 mL IJ SOAJ injection Inject 0.3 mLs (0.3 mg total) into the muscle once. 05/31/14   Waldon Merl, PA-C  fluticasone (FLONASE) 50 MCG/ACT nasal spray Place 2 sprays into both nostrils daily. 09/22/14   Waldon Merl, PA-C  lidocaine (XYLOCAINE) 5 % ointment Apply 1 application topically at bedtime as needed. 05/21/14   Glendale Chard, DO  Multiple Vitamin (MULTIVITAMIN) capsule Take 1 capsule by mouth  daily. Womens Reproductive Health with Folic Acid    Historical Provider, MD  traZODone (DESYREL) 50 MG tablet Take 50 mg by mouth at bedtime as needed for sleep.    Historical Provider, MD   BP 152/101 mmHg  Pulse 95  Temp(Src) 98.7 F (37.1 C) (Oral)  Resp 16  Ht 5\' 8"  (1.727 m)  Wt 242 lb (109.77 kg)  BMI 36.80 kg/m2  SpO2 100%  LMP 10/05/2014 Physical Exam  Constitutional: She appears well-developed and well-nourished. No distress.  HENT:  Head: Normocephalic and atraumatic.  Eyes: Conjunctivae are normal. No scleral icterus.  Neck: Normal range of motion. Neck supple.  Cardiovascular: Normal rate, regular rhythm and normal heart sounds.   Pulmonary/Chest: Effort normal and breath sounds normal. No respiratory distress. She has no wheezes. She has no rales. She exhibits no tenderness.  Abdominal: Soft. Bowel sounds are normal. She exhibits no distension and no mass. There is tenderness. There is no rebound and no guarding.  Obese abdomen, mild tenderness in suprapubic region. No rebound, guarding or masses. No CVAT  Musculoskeletal: Normal range of motion.  Neurological: She is alert.  Skin: Skin is warm and dry. She is not diaphoretic.  Nursing note and vitals reviewed.   ED Course  Procedures (including critical care time) Labs Review Labs Reviewed  POCT URINALYSIS DIP (MANUAL ENTRY) - Abnormal; Notable for the following:    Glucose, UA =250 (*)    Blood, UA trace-intact (*)    All other components within normal limits  POCT FASTING CBG [facility] MANUAL ENTRY - Abnormal; Notable for the following:    POCT Glucose ([facility]) 253 (*)    All other components within normal limits  POCT URINE PREGNANCY    Imaging Review No results found.   MDM   1. Polyuria   2. Glucosuria   3. Type 2 diabetes mellitus with hyperglycemia   4. Elevated blood pressure    Patient is a 29 year old female with history of hypertension, Type 2 DM, and PCOS, complaining of polyuria and lower  abdominal pain.  Patient appears well, nontoxic is afebrile.  Patient does have elevated blood pressure, however, states she did not take her blood pressure medication last night.  States she was recently started on this medication about one month ago.  Denies headache, chest pain or shortness of breath.  Abdominal exam: Soft, mild tenderness in suprapubic region.  No rebound, guarding or masses palpated.  Doubt surgical abdomen. Doubt TOA, ovarian torsion or ectopic pregnancy. Urine preg: negative. UA: No evidence of infection, however, some glucose at 250mg /dL; no proteinuria. Will get a CBG. CBG: 253. No evidence of DKA. Polyuria likely due to elevated blood glucose. Strongly encouraged pt to monitor her diet and f/u with her PCP for her diabetes as well as HTN control. Lower abdominal pain possible due to her PCOS, however, w/o pain  at this time, no indication for emergent imaging at this time. Encouraged pt to f/u with OB/GYN for recheck of symptoms if not improving this week. Discussed signs/symptoms that warrant call to 911 or visit to closest emergency department. Patient verbalized understanding and agreement with treatment plan.    Junius Finner, PA-C 11/01/14 1350

## 2014-11-01 NOTE — Discharge Instructions (Signed)
Please be sure to take your lipid pressure medication as prescribed.  Please be sure to monitor your blood pressure.  As you're medication dose may need to be increased or changed. Today your lunch sugar was found to be high, which could be the cause of your increased urinary frequency.  Be sure to keep a close eye on your blood sugar and inform your physician of any changes as you may need to restart diabetes medications.   Please call your OB/GYN if you develop recurrent lower abdominal pain is you may need further evaluation of your PCOS via ultrasound or pelvic exams. Please call 911 or go to closest emergency department if you or unable to keep down fluids develop severe abdominal pain unable to keep down fluids have dizziness or near syncope, or other new concerning symptoms develop.

## 2014-11-01 NOTE — ED Notes (Signed)
Pt c/o polyuria and bladder pain x 5 days. C/o a severe "cervical spasm" last night that has resolved.

## 2014-11-04 ENCOUNTER — Telehealth: Payer: Self-pay | Admitting: Emergency Medicine

## 2014-11-08 ENCOUNTER — Ambulatory Visit: Payer: 59 | Admitting: Physician Assistant

## 2014-11-08 ENCOUNTER — Telehealth: Payer: Self-pay | Admitting: Physician Assistant

## 2014-11-08 NOTE — Telephone Encounter (Signed)
Checked VM 3:45pm, Pt left VM 11/08/14 8:06am to cancel appt stating she got in with OB. Cancelled appt.

## 2014-11-27 ENCOUNTER — Encounter: Payer: Self-pay | Admitting: Physician Assistant

## 2014-11-29 MED ORDER — GABAPENTIN 100 MG PO CAPS: 200.0000 mg | ORAL_CAPSULE | Freq: Every day | ORAL | Status: DC

## 2014-11-29 NOTE — Addendum Note (Signed)
Addended by: Marcelline Mates on: 11/29/2014 04:47 PM   Modules accepted: Orders

## 2014-12-06 ENCOUNTER — Encounter: Payer: Self-pay | Admitting: Physician Assistant

## 2014-12-08 ENCOUNTER — Encounter: Payer: Self-pay | Admitting: Physician Assistant

## 2014-12-08 ENCOUNTER — Ambulatory Visit (INDEPENDENT_AMBULATORY_CARE_PROVIDER_SITE_OTHER): Payer: 59 | Admitting: Physician Assistant

## 2014-12-08 VITALS — BP 128/92 | HR 93 | Temp 98.3°F | Resp 16 | Ht 68.0 in | Wt 239.2 lb

## 2014-12-08 DIAGNOSIS — T7800XA Anaphylactic reaction due to unspecified food, initial encounter: Secondary | ICD-10-CM | POA: Diagnosis not present

## 2014-12-08 MED ORDER — HYDROCODONE-ACETAMINOPHEN 5-325 MG PO TABS: 1.0000 | ORAL_TABLET | ORAL | Status: DC | PRN

## 2014-12-08 NOTE — Progress Notes (Signed)
Patient presents to clinic today c/o episode last week of facial swelling and difficulty swallowing after exposure to unknown allergen. 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 Patient endorses hx of pineapple allergy causing palpitations and severe rash, but does not remember exposure to this fruit. Did eat some pork in a red sauce at the party but was told by chef there was no pineapple in the dish. Was given 4 Benadryl and Tagement. Had epi-pen but did not use as she was still able to breath. Denies recurrence of this.   Past Medical History  Diagnosis Date  . PCOS (polycystic ovarian syndrome)   . Gallstones   . Diabetes mellitus type II, controlled     Current Outpatient Prescriptions on File Prior to Visit  Medication Sig Dispense Refill  . dicyclomine (BENTYL) 10 MG capsule Take 10 mg by mouth 4 (four) times daily -  before meals and at bedtime. As Needed    . EPINEPHrine 0.3 mg/0.3 mL IJ SOAJ injection Inject 0.3 mLs (0.3 mg total) into the muscle once. 1 Device 0  . fluticasone (FLONASE) 50 MCG/ACT nasal spray Place 2 sprays into both nostrils daily. (Patient taking differently: Place 2 sprays into both nostrils daily as needed. ) 16 g 6  . gabapentin (NEURONTIN) 100 MG capsule Take 2 capsules (200 mg total) by mouth at bedtime. 60 capsule 3  . lidocaine (XYLOCAINE) 5 % ointment Apply 1 application topically at bedtime as needed. 35.44 g 3  . Multiple Vitamin (MULTIVITAMIN) capsule Take 1 capsule by mouth daily. Womens Reproductive Health with Folic Acid    . amLODipine (NORVASC) 5 MG tablet Take 1 tablet (5 mg total) by mouth daily. (Patient not taking: Reported on 12/08/2014) 30 tablet 1   No current facility-administered medications on file prior to visit.    Allergies  Allergen Reactions  . Plan B [Levonorgestrel] Anaphylaxis  . Progesterone Anaphylaxis  . Mobic [Meloxicam] Rash  . Pineapple Palpitations and Rash    Family History  Problem Relation Age  of Onset  . Diabetes Mother     Living  . Hypertension Mother   . Uterine cancer Mother     Pre-cancerous  . Thyroid disease Mother   . Diabetes type II Father     Living  . Hypertension Father   . Gout Father   . Neuropathy Father   . Breast cancer Maternal Grandmother   . Heart attack Paternal Grandfather   . Colon cancer Paternal Grandfather   . Colon cancer Maternal Grandfather   . Heart defect Brother   . Prostate cancer Maternal Uncle   . Alcoholism Other     Paternal Side  . Diabetes Maternal Aunt   . Diabetes Maternal Uncle     Social History   Social History  . Marital Status: Married    Spouse Name: N/A  . Number of Children: N/A  . Years of Education: N/A   Social History Main Topics  . Smoking status: Never Smoker   . Smokeless tobacco: Never Used  . Alcohol Use: 0.0 oz/week    0 Standard drinks or equivalent per week     Comment: 1 glass of wine a month  . Drug Use: No  . Sexual Activity:    Partners: Male   Other Topics Concern  . None   Social History Narrative   Lives with husband in a one story home.  No children.     Works as an Museum/gallery exhibitions officer.  Education: associates degree.   Review of Systems - See HPI.  All other ROS are negative.  BP 128/92 mmHg  Pulse 93  Temp(Src) 98.3 F (36.8 C) (Oral)  Resp 16  Ht 5\' 8"  (1.727 m)  Wt 239 lb 4 oz (108.523 kg)  BMI 36.39 kg/m2  SpO2 98%  LMP 12/06/2014  Physical Exam  Constitutional: She is oriented to person, place, and time and well-developed, well-nourished, and in no distress.  HENT:  Head: Normocephalic and atraumatic.  Eyes: Conjunctivae are normal.  Neck: Neck supple.  Cardiovascular: Normal rate, regular rhythm, normal heart sounds and intact distal pulses.   Pulmonary/Chest: Effort normal and breath sounds normal. No respiratory distress. She has no wheezes. She has no rales. She exhibits no tenderness.  Lymphadenopathy:    She has no cervical adenopathy.  Neurological: She is alert  and oriented to person, place, and time.  Skin: Skin is warm and dry. No rash noted.  Psychiatric: Affect normal.  Vitals reviewed.   Recent Results (from the past 2160 hour(s))  POCT urinalysis dipstick (new)     Status: Abnormal   Collection Time: 11/01/14 12:58 PM  Result Value Ref Range   Color, UA yellow yellow   Clarity, UA clear clear   Glucose, UA =250 (A) negative   Bilirubin, UA negative negative   Ketones, POC UA negative negative   Spec Grav, UA >=1.030 1.005 - 1.03   Blood, UA trace-intact (A) negative   pH, UA 5.0 5 - 8   Protein Ur, POC negative negative   Urobilinogen, UA 0.2 0 - 1   Nitrite, UA Negative Negative   Leukocytes, UA Negative Negative  POCT urine pregnancy     Status: None   Collection Time: 11/01/14 12:58 PM  Result Value Ref Range   Preg Test, Ur Negative Negative  POCT fasting CBG (manual entry)     Status: Abnormal   Collection Time: 11/01/14  1:13 PM  Result Value Ref Range   POCT Glucose ([facility]) 253 (A) 70 - 99 mg/dL    Assessment/Plan: Anaphylactic reaction due to food Suspect due to exposure to pineapple but other food allergens cannot be excluded. Supportive measures discussed. Keep Epi Pen handy. Referral to Allergy placed.

## 2014-12-08 NOTE — Assessment & Plan Note (Signed)
Suspect due to exposure to pineapple but other food allergens cannot be excluded. Supportive measures discussed. Keep Epi Pen handy. Referral to Allergy placed.

## 2014-12-08 NOTE — Patient Instructions (Signed)
Keep your phone on as you will be contacted for assessment by Allergy Specialist. Avoid new foods. Wear gloves when handling food products you have not prepared. Keep Epi Pen available.  Anaphylactic Reaction An anaphylactic reaction is a sudden, severe allergic reaction that involves the whole body. It can be life threatening. A hospital stay is often required. People with asthma, eczema, or hay fever are slightly more likely to have an anaphylactic reaction. CAUSES  An anaphylactic reaction may be caused by anything to which you are allergic. After being exposed to the allergic substance, your immune system becomes sensitized to it. When you are exposed to that allergic substance again, an allergic reaction can occur. Common causes of an anaphylactic reaction include:  Medicines.  Foods, especially peanuts, wheat, shellfish, milk, and eggs.  Insect bites or stings.  Blood products.  Chemicals, such as dyes, latex, and contrast material used for imaging tests. SYMPTOMS  When an allergic reaction occurs, the body releases histamine and other substances. These substances cause symptoms such as tightening of the airway. Symptoms often develop within seconds or minutes of exposure. Symptoms may include:  Skin rash or hives.  Itching.  Chest tightness.  Swelling of the eyes, tongue, or lips.  Trouble breathing or swallowing.  Lightheadedness or fainting.  Anxiety or confusion.  Stomach pains, vomiting, or diarrhea.  Nasal congestion.  A fast or irregular heartbeat (palpitations). DIAGNOSIS  Diagnosis is based on your history of recent exposure to allergic substances, your symptoms, and a physical exam. Your caregiver may also perform blood or urine tests to confirm the diagnosis. TREATMENT  Epinephrine medicine is the main treatment for an anaphylactic reaction. Other medicines that may be used for treatment include antihistamines, steroids, and albuterol. In severe cases,  fluids and medicine to support blood pressure may be given through an intravenous line (IV). Even if you improve after treatment, you need to be observed to make sure your condition does not get worse. This may require a stay in the hospital. [place] a medical alert bracelet or necklace stating your allergy.  You and your family must learn how to use an anaphylaxis kit or give an epinephrine injection to temporarily treat an emergency allergic reaction. Always carry your epinephrine injection or anaphylaxis kit with you. This can be lifesaving if you have a severe reaction.  Do not drive or perform tasks after treatment until the medicines used to treat your reaction have worn off, or until your caregiver says it is okay.  If you have hives or a rash:  Take medicines as directed by your caregiver.  You may use an over-the-counter antihistamine (diphenhydramine) as needed.  Apply cold compresses to the skin or take baths in cool water. Avoid hot baths or showers. SEEK MEDICAL CARE IF:   You develop symptoms of an allergic reaction to a new substance. Symptoms may start right away or minutes later.  You develop a rash, hives, or itching.  You develop new symptoms. SEEK IMMEDIATE MEDICAL CARE IF:   You have swelling of the mouth, difficulty breathing, or wheezing.  You have a tight feeling in your chest or throat.  You develop hives, swelling, or itching all over your body.  You develop severe vomiting or diarrhea.  You feel faint or pass out. This is an emergency. Use your epinephrine injection or anaphylaxis kit as you have been instructed. Call your local emergency services (911 in [place] Even if you improve after the injection, you  need to be examined at a hospital emergency department. MAKE SURE YOU:   Understand these instructions.  Will watch your condition.  Will get help right away if you are not doing well or get worse. Document Released:  03/12/2005 Document Revised: 03/17/2013 Document Reviewed: 06/13/2011 [facility] Patient Information 2015 [place], [place]. This information is not intended to replace advice given to you by your health care provider. Make sure you discuss any questions you have with your health care provider.

## 2014-12-08 NOTE — Progress Notes (Signed)
Pre visit review using our clinic review tool, if applicable. No additional management support is needed unless otherwise documented below in the visit note/SLS  

## 2014-12-13 ENCOUNTER — Telehealth: Payer: 59 | Admitting: Family

## 2014-12-13 DIAGNOSIS — J069 Acute upper respiratory infection, unspecified: Secondary | ICD-10-CM

## 2014-12-13 MED ORDER — AZITHROMYCIN 250 MG PO TABS: ORAL_TABLET | ORAL | Status: DC

## 2014-12-13 MED ORDER — BENZONATATE 200 MG PO CAPS: 200.0000 mg | ORAL_CAPSULE | Freq: Three times a day (TID) | ORAL | Status: DC | PRN

## 2014-12-13 NOTE — Progress Notes (Signed)

## 2015-01-27 ENCOUNTER — Encounter: Payer: Self-pay | Admitting: Allergy and Immunology

## 2015-01-27 ENCOUNTER — Ambulatory Visit (INDEPENDENT_AMBULATORY_CARE_PROVIDER_SITE_OTHER): Payer: 59 | Admitting: Allergy and Immunology

## 2015-01-27 VITALS — BP 152/100 | HR 88 | Temp 98.3°F | Resp 22 | Ht 66.93 in | Wt 238.1 lb

## 2015-01-27 DIAGNOSIS — T7840XA Allergy, unspecified, initial encounter: Secondary | ICD-10-CM

## 2015-01-27 NOTE — Patient Instructions (Addendum)
  1. Allergen avoidance measures  2. Epi-Pen, benadryl, MD / ER  3. Blood test - alpha gal panel, pineapple immunocap  4. Further evaluation?  5. Get a flu vaccine

## 2015-01-27 NOTE — Progress Notes (Signed)
[facility]    NEW PATIENT NOTE  Referring Provider: Waldon Merl, PA-C Primary Provider: Piedad Climes, PA-C    Subjective:   Patient ID: Veronica Mclaughlin is a 29 y.o. female with a chief complaint of Allergic Reaction  and the following problems:  HPI Comments:  Veronica Mclaughlin presents to this clinic in evaluation of an allergic reaction that occurred in August 2016. Apparently while at a party, and after eating pulled pork with rice, she is developed this hot sensation on her face associated with throat closing and inability to breathe. She took 2 Benadryl and to Pepcid and used her mom's inhaler and nebulizer and then contacted EMS. By the time EMS came she was much better. She did have increased heart rate and increased blood pressure. She did get nauseated with this episode and maybe had some itchiness of the skin around her neck. She's no vomiting or diarrhea or other respiratory tract symptoms. There was no obvious provoking factor giving rise to this reaction. She does have a history of developing hives after eating pineapple. She states that she is also developed a episode of hives and shortness of breath after taking a plan B pill, progesterone pill, and Mobic although she can take ibuprofen without any problem. She does not have any associated atopic disease.   Past Medical History  Diagnosis Date  . PCOS (polycystic ovarian syndrome)   . Gallstones   . Diabetes mellitus type II, controlled (HCC)   . Diabetic neuropathy (HCC)   . History of colonic polyps     Past Surgical History  Procedure Laterality Date  . Cholecystectomy  2014  . Colonoscopy  2005    Outpatient Encounter Prescriptions as of 01/27/2015  Medication Sig  . dicyclomine (BENTYL) 10 MG capsule Take 10 mg by mouth 4 (four) times daily -  before meals and at bedtime. As Needed  . diphenhydrAMINE (SOMINEX) 25 MG tablet Take 25 mg by mouth daily.  Marland Kitchen  EPINEPHrine 0.3 mg/0.3 mL IJ SOAJ injection Inject 0.3 mLs (0.3 mg total) into the muscle once.  . gabapentin (NEURONTIN) 100 MG capsule Take 2 capsules (200 mg total) by mouth at bedtime.  Marland Kitchen HYDROcodone-acetaminophen (NORCO/VICODIN) 5-325 MG per tablet Take 1 tablet by mouth every 4 (four) hours as needed for moderate pain.  Marland Kitchen lidocaine (XYLOCAINE) 5 % ointment Apply 1 application topically at bedtime as needed.  . Multiple Vitamin (MULTIVITAMIN) capsule Take 1 capsule by mouth daily. Womens Reproductive Health with Folic Acid  . amLODipine (NORVASC) 5 MG tablet Take 1 tablet (5 mg total) by mouth daily. (Patient not taking: Reported on 12/08/2014)  . azithromycin (ZITHROMAX) 250 MG tablet Take 500 mg once, then 250 mg for four days (Patient not taking: Reported on 01/27/2015)  . benzonatate (TESSALON) 200 MG capsule Take 1 capsule (200 mg total) by mouth 3 (three) times daily as needed for cough. (Patient not taking: Reported on 01/27/2015)  . clomiPHENE (CLOMID) 50 MG tablet PATIENT TO STAOP NEXT MONTH  . fluticasone (FLONASE) 50 MCG/ACT nasal spray Place 2 sprays into both nostrils daily. (Patient not taking: Reported on 01/27/2015)  . metFORMIN (GLUCOPHAGE-XR) 500 MG 24 hr tablet Take 500 mg by mouth daily with breakfast.   No facility-administered encounter medications on file as of 01/27/2015.    No orders of the defined types were placed in this encounter.    Allergies  Allergen Reactions  . Plan B [Levonorgestrel] Anaphylaxis  .  Progesterone Anaphylaxis  . Mobic [Meloxicam] Rash  . Pineapple Palpitations and Rash    Review of Systems  Constitutional: Negative for fever and chills.  HENT: Negative for congestion, ear pain, facial swelling, nosebleeds, postnasal drip, rhinorrhea, sinus pressure, sneezing, sore throat, tinnitus, trouble swallowing and voice change.   Eyes: Negative for pain, discharge, redness and itching.  Respiratory: Negative for cough, choking, chest tightness,  shortness of breath, wheezing and stridor.   Cardiovascular: Negative for chest pain and leg swelling.  Gastrointestinal: Negative for nausea, vomiting and abdominal pain.  Endocrine: Negative for cold intolerance and heat intolerance.  Genitourinary: Negative for difficulty urinating.  Musculoskeletal: Negative for myalgias and arthralgias.  Allergic/Immunologic: Negative.   Neurological: Negative for dizziness.       Foot neuropathy  Hematological: Negative for adenopathy.    Family History  Problem Relation Age of Onset  . Diabetes Mother     Living  . Hypertension Mother   . Uterine cancer Mother     Pre-cancerous  . Thyroid disease Mother   . Diabetes type II Father     Living  . Hypertension Father   . Gout Father   . Neuropathy Father   . Breast cancer Maternal Grandmother   . Heart attack Paternal Grandfather   . Colon cancer Paternal Grandfather   . Colon cancer Maternal Grandfather   . Heart defect Brother   . Prostate cancer Maternal Uncle   . Alcoholism Other     Paternal Side  . Diabetes Maternal Aunt   . Diabetes Maternal Uncle     Social History   Social History  . Marital Status: Married    Spouse Name: N/A  . Number of Children: N/A  . Years of Education: N/A   Occupational History  . Not on file.   Social History Main Topics  . Smoking status: Never Smoker   . Smokeless tobacco: Never Used  . Alcohol Use: 0.0 oz/week    0 Standard drinks or equivalent per week     Comment: 1 glass of wine a month  . Drug Use: No  . Sexual Activity:    Partners: Male   Other Topics Concern  . Not on file   Social History Narrative   Lives with husband in a one story home.  No children.     Works as an Museum/gallery exhibitions officerMT.     Education: associates degree.    Environmental and Social history  Lives in a house with a aunt environment, cats and dogs located inside the household, carpeting in the bedroom, sleeping on a stuff mattress without any plastic on the bed or  pillows, and smokers located inside the household, and employment as a EMT   Objective:   Filed Vitals:   01/27/15 0938  BP: 152/100  Pulse: 88  Temp: 98.3 F (36.8 C)  Resp: 22   Height: 5' 6.93" (170 cm) Weight: 238 lb 1.6 oz (108 kg)  Physical Exam  Constitutional: She appears well-developed and well-nourished. No distress.  HENT:  Head: Normocephalic and atraumatic. Head is without right periorbital erythema and without left periorbital erythema.  Right Ear: Tympanic membrane, external ear and ear canal normal. No drainage or tenderness. No foreign bodies. Tympanic membrane is not injected, not scarred, not perforated, not erythematous, not retracted and not bulging. No middle ear effusion.  Left Ear: Tympanic membrane, external ear and ear canal normal. No drainage or tenderness. No foreign bodies. Tympanic membrane is not injected, not scarred, not perforated, not  erythematous, not retracted and not bulging.  No middle ear effusion.  Nose: Nose normal. No mucosal edema, rhinorrhea, nose lacerations or sinus tenderness.  No foreign bodies.  Mouth/Throat: Oropharynx is clear and moist. No oropharyngeal exudate, posterior oropharyngeal edema, posterior oropharyngeal erythema or tonsillar abscesses.  Eyes: Lids are normal. Right eye exhibits no chemosis, no discharge and no exudate. No foreign body present in the right eye. Left eye exhibits no chemosis, no discharge and no exudate. No foreign body present in the left eye. Right conjunctiva is not injected. Left conjunctiva is not injected.  Neck: Neck supple. No tracheal tenderness present. No tracheal deviation and no edema present. No thyroid mass and no thyromegaly present.  Cardiovascular: Normal rate, regular rhythm, S1 normal and S2 normal.  Exam reveals no gallop.   No murmur heard. Pulmonary/Chest: No accessory muscle usage or stridor. No respiratory distress. She has no wheezes. She has no rhonchi. She has no rales.   Abdominal: Soft. There is no hepatosplenomegaly. There is no tenderness. There is no rigidity, no rebound and no guarding.  Lymphadenopathy:       Head (right side): No tonsillar adenopathy present.       Head (left side): No tonsillar adenopathy present.    She has no cervical adenopathy.  Neurological: She is alert.  Skin: No rash noted. She is not diaphoretic.  Psychiatric: She has a normal mood and affect. Her behavior is normal.    Diagnostics:  Allergy skin tests were performed. She did not demonstrate any hypersensitivity to a screening panel of foods   Assessment and Plan:    1. Allergic reaction, initial encounter      1. Allergen avoidance measures  2. Epi-Pen, benadryl, MD / ER  3. Blood test - alpha gal panel, pineapple immunocap  4. Further evaluation?  5. Get a flu vaccine  For Joci's isolated allergic reaction we will follow through with blood tests including an alpha gal panel and a immunocap Directed against pineapple. I will not have her undergo any further evaluation unless of course this is a recurrent event. For her drug allergies there is no specific diagnostic testing available for these sensitivity states other than challenge. I did mention to her that for her upcoming infertility treatment she can try attempts of the usual dose for a few days and then 50% of the usual dose for a few days just to make sure she can tolerate these agents. I will contact her with the results of her blood tests once they're available for review.     Laurette Schimke, MD [facility]

## 2015-02-01 ENCOUNTER — Other Ambulatory Visit: Payer: Self-pay | Admitting: Allergy and Immunology

## 2015-02-05 LAB — F210-IGE PINEAPPLE: Pineapple IgE: <0.1 kU/L

## 2015-02-06 LAB — ALPHA-GAL PANEL
Alpha Gal IgE*: <0.1 kU/L (ref <0.35)
Beef (Bos spp) IgE: <0.1 kU/L (ref <0.35)
Class Interpretation: 0
Class Interpretation: 0
Lamb/Mutton (Ovis spp) IgE: <0.1 kU/L (ref <0.35)
PORK CLASS INTERPRETATION: 0
Pork (Sus spp) IgE: <0.1 kU/L (ref <0.35)

## 2015-02-07 NOTE — Progress Notes (Signed)
Immunocap results are in Chart review under the Labs tab.

## 2015-02-21 ENCOUNTER — Emergency Department (HOSPITAL_BASED_OUTPATIENT_CLINIC_OR_DEPARTMENT_OTHER)
Admission: EM | Admit: 2015-02-21 | Discharge: 2015-02-21 | Disposition: A | Discharge status: Home / Self Care | Payer: 59 | Attending: Emergency Medicine | Admitting: Emergency Medicine

## 2015-02-21 ENCOUNTER — Encounter (HOSPITAL_BASED_OUTPATIENT_CLINIC_OR_DEPARTMENT_OTHER): Payer: Self-pay | Admitting: *Deleted

## 2015-02-21 DIAGNOSIS — Y9289 Other specified places as the place of occurrence of the external cause: Secondary | ICD-10-CM | POA: Diagnosis not present

## 2015-02-21 DIAGNOSIS — Z79899 Other long term (current) drug therapy: Secondary | ICD-10-CM | POA: Insufficient documentation

## 2015-02-21 DIAGNOSIS — Y998 Other external cause status: Secondary | ICD-10-CM | POA: Insufficient documentation

## 2015-02-21 DIAGNOSIS — E1140 Type 2 diabetes mellitus with diabetic neuropathy, unspecified: Secondary | ICD-10-CM | POA: Diagnosis not present

## 2015-02-21 DIAGNOSIS — Z8719 Personal history of other diseases of the digestive system: Secondary | ICD-10-CM | POA: Insufficient documentation

## 2015-02-21 DIAGNOSIS — X58XXXA Exposure to other specified factors, initial encounter: Secondary | ICD-10-CM | POA: Diagnosis not present

## 2015-02-21 DIAGNOSIS — Y9389 Activity, other specified: Secondary | ICD-10-CM | POA: Insufficient documentation

## 2015-02-21 DIAGNOSIS — Z86010 Personal history of colonic polyps: Secondary | ICD-10-CM | POA: Diagnosis not present

## 2015-02-21 DIAGNOSIS — T7840XA Allergy, unspecified, initial encounter: Secondary | ICD-10-CM | POA: Diagnosis not present

## 2015-02-21 DIAGNOSIS — R21 Rash and other nonspecific skin eruption: Secondary | ICD-10-CM | POA: Diagnosis present

## 2015-02-21 LAB — BASIC METABOLIC PANEL
ANION GAP: 11 (ref 5–15)
BUN: 17 mg/dL (ref 6–20)
CALCIUM: 8.9 mg/dL (ref 8.9–10.3)
CO2: 24 mmol/L (ref 22–32)
Chloride: 102 mmol/L (ref 101–111)
Creatinine, Ser: 0.72 mg/dL (ref 0.44–1.00)
GFR calc Af Amer: >60 mL/min (ref >60)
Glucose, Bld: 162 mg/dL — ABNORMAL HIGH (ref 65–99)
Potassium: 3.4 mmol/L — ABNORMAL LOW (ref 3.5–5.1)
Sodium: 137 mmol/L (ref 135–145)

## 2015-02-21 LAB — CBC WITH DIFFERENTIAL/PLATELET
BASOS ABS: 0 10*3/uL (ref 0.0–0.1)
BASOS PCT: 0 %
Eosinophils Absolute: 0.1 10*3/uL (ref 0.0–0.7)
Eosinophils Relative: 2 %
HCT: 40.2 % (ref 36.0–46.0)
HEMOGLOBIN: 14.6 g/dL (ref 12.0–15.0)
Lymphocytes Relative: 35 %
Lymphs Abs: 2.6 10*3/uL (ref 0.7–4.0)
MCH: 32.4 pg (ref 26.0–34.0)
MCHC: 36.3 g/dL — ABNORMAL HIGH (ref 30.0–36.0)
MCV: 89.3 fL (ref 78.0–100.0)
Monocytes Absolute: 0.5 10*3/uL (ref 0.1–1.0)
Monocytes Relative: 7 %
NEUTROS PCT: 56 %
Neutro Abs: 4.1 10*3/uL (ref 1.7–7.7)
Platelets: 187 10*3/uL (ref 150–400)
RBC: 4.5 MIL/uL (ref 3.87–5.11)
RDW: 11.8 % (ref 11.5–15.5)
WBC: 7.3 10*3/uL (ref 4.0–10.5)

## 2015-02-21 MED ORDER — SODIUM CHLORIDE 0.9 % IV SOLN
INTRAVENOUS | Status: DC
  Administered 2015-02-21: 20:00:00 via INTRAVENOUS

## 2015-02-21 MED ORDER — FAMOTIDINE 20 MG PO TABS: 20.0000 mg | ORAL_TABLET | Freq: Two times a day (BID) | ORAL | Status: DC

## 2015-02-21 MED ORDER — FAMOTIDINE IN NACL 20-0.9 MG/50ML-% IV SOLN
INTRAVENOUS | Status: AC
  Filled 2015-02-21: qty 50

## 2015-02-21 MED ORDER — PREDNISONE 10 MG PO TABS: 40.0000 mg | ORAL_TABLET | Freq: Every day | ORAL | Status: DC

## 2015-02-21 MED ORDER — DIPHENHYDRAMINE HCL 50 MG/ML IJ SOLN
25.0000 mg | Freq: Once | INTRAMUSCULAR | Status: AC
  Administered 2015-02-21: 25 mg via INTRAVENOUS
  Filled 2015-02-21: qty 1

## 2015-02-21 MED ORDER — FAMOTIDINE IN NACL 20-0.9 MG/50ML-% IV SOLN
20.0000 mg | Freq: Once | INTRAVENOUS | Status: AC
  Administered 2015-02-21: 20 mg via INTRAVENOUS

## 2015-02-21 MED ORDER — DIPHENHYDRAMINE HCL 50 MG/ML IJ SOLN
25.0000 mg | Freq: Once | INTRAMUSCULAR | Status: AC
  Administered 2015-02-21: 19:00:00 via INTRAVENOUS

## 2015-02-21 MED ORDER — METHYLPREDNISOLONE SODIUM SUCC 125 MG IJ SOLR
125.0000 mg | Freq: Once | INTRAMUSCULAR | Status: AC
  Administered 2015-02-21: 125 mg via INTRAVENOUS
  Filled 2015-02-21: qty 2

## 2015-02-21 NOTE — ED Notes (Signed)
Pt c/o rash to face , chest , back and arms x 15 mins

## 2015-02-21 NOTE — ED Notes (Signed)
Patent complains of burning to the throat. Patient is hyperventalating at times breathing hard. Patient face worsening in redness and right eye is getting more and more swollen

## 2015-02-21 NOTE — ED Provider Notes (Signed)
CSN: [id number]     Arrival date & time 02/21/15  1858 History  By signing my name below, I, Lyndel Safe, attest that this documentation has been prepared under the direction and in the presence of Vanetta Mulders, MD. Electronically Signed: Lyndel Safe, ED Scribe. 02/21/2015. 10:37 PM.   Chief Complaint  Patient presents with  . Allergic Reaction   The history is provided by the patient. No language interpreter was used.   HPI Comments: Veronica Mclaughlin is a 29 y.o. female, with a h/o DM II, who presents to the Emergency Department complaining of allergic reaction. Patient broke out with red splotchy rash with itching. Little bit of tightness in the throat maybe thought maybe the tongue was swelling some slight difficulty with breathing. Patient is been worked up for allergic reactions without a specific cause. Does have a EpiPen at home. This is typical for what happens. Sometimes breathing gets to be very difficult.      Past Medical History  Diagnosis Date  . PCOS (polycystic ovarian syndrome)   . Gallstones   . Diabetes mellitus type II, controlled (HCC)   . Diabetic neuropathy (HCC)   . History of colonic polyps    Past Surgical History  Procedure Laterality Date  . Cholecystectomy  2014  . Colonoscopy  2005   Family History  Problem Relation Age of Onset  . Diabetes Mother     Living  . Hypertension Mother   . Uterine cancer Mother     Pre-cancerous  . Thyroid disease Mother   . Diabetes type II Father     Living  . Hypertension Father   . Gout Father   . Neuropathy Father   . Breast cancer Maternal Grandmother   . Heart attack Paternal Grandfather   . Colon cancer Paternal Grandfather   . Colon cancer Maternal Grandfather   . Heart defect Brother   . Prostate cancer Maternal Uncle   . Alcoholism Other     Paternal Side  . Diabetes Maternal Aunt   . Diabetes Maternal Uncle    Social History  Substance Use Topics  . Smoking status: Never Smoker   .  Smokeless tobacco: Never Used  . Alcohol Use: 0.0 oz/week    0 Standard drinks or equivalent per week     Comment: 1 glass of wine a month   OB History    No data available     Review of Systems  Constitutional: Negative for fever.  HENT: Negative for congestion, rhinorrhea, sore throat and trouble swallowing.   Eyes: Negative for redness and visual disturbance.  Respiratory: Negative for cough and shortness of breath.   Cardiovascular: Negative for chest pain and leg swelling.  Gastrointestinal: Negative for nausea, vomiting, abdominal pain and diarrhea.  Genitourinary: Negative for dysuria and hematuria.  Musculoskeletal: Negative for back pain and neck pain.  Skin: Positive for rash.  Neurological: Negative for headaches.  Hematological: Does not bruise/bleed easily.  Psychiatric/Behavioral: Negative for confusion.   Allergies  Plan b; Progesterone; Mobic; and Pineapple  Home Medications   Prior to Admission medications   Medication Sig Start Date End Date Taking? Authorizing Provider  clomiPHENE (CLOMID) 50 MG tablet PATIENT TO STAOP NEXT MONTH 01/21/15   Historical Provider, MD  dicyclomine (BENTYL) 10 MG capsule Take 10 mg by mouth 4 (four) times daily -  before meals and at bedtime. As Needed    Historical Provider, MD  diphenhydrAMINE (SOMINEX) 25 MG tablet Take 25 mg by mouth daily.  Historical Provider, MD  EPINEPHrine 0.3 mg/0.3 mL IJ SOAJ injection Inject 0.3 mLs (0.3 mg total) into the muscle once. 05/31/14   Waldon MerlWilliam C Martin, PA-C  famotidine (PEPCID) 20 MG tablet Take 1 tablet (20 mg total) by mouth 2 (two) times daily. 02/21/15   Vanetta MuldersScott Terence Googe, MD  gabapentin (NEURONTIN) 100 MG capsule Take 2 capsules (200 mg total) by mouth at bedtime. 11/29/14   Waldon MerlWilliam C Martin, PA-C  HYDROcodone-acetaminophen (NORCO/VICODIN) 5-325 MG per tablet Take 1 tablet by mouth every 4 (four) hours as needed for moderate pain. 12/08/14   Waldon MerlWilliam C Martin, PA-C  lidocaine (XYLOCAINE) 5 %  ointment Apply 1 application topically at bedtime as needed. 05/21/14   [place] Chardonika K Patel, DO  Multiple Vitamin (MULTIVITAMIN) capsule Take 1 capsule by mouth daily. Womens Reproductive Health with Folic Acid    Historical Provider, MD  predniSONE (DELTASONE) 10 MG tablet Take 4 tablets (40 mg total) by mouth daily. 02/21/15   Vanetta MuldersScott Margit Batte, MD   BP 140/97 mmHg  Pulse 82  Resp 16  SpO2 100%  LMP 02/09/2015 Physical Exam  Constitutional: She is oriented to person, place, and time. She appears well-developed and well-nourished. No distress.  HENT:  Head: Normocephalic and atraumatic.  Mouth/Throat: Oropharynx is clear and moist. No oropharyngeal exudate.  No tongue swelling.  Eyes: EOM are normal.  Neck: Normal range of motion.  Cardiovascular: Normal rate, regular rhythm and normal heart sounds.   Pulmonary/Chest: Effort normal and breath sounds normal.  Abdominal: Soft. She exhibits no distension. There is no tenderness.  Musculoskeletal: Normal range of motion.  Neurological: She is alert and oriented to person, place, and time.  Skin: Skin is warm and dry.  Patient with blotchy red rash not exactly hives. Throughout face arms most of body itchy in nature.  Psychiatric: She has a normal mood and affect. Judgment normal.  Nursing note and vitals reviewed.   ED Course  Procedures  DIAGNOSTIC STUDIES: Oxygen Saturation is 100% on RA, normal by my interpretation.    COORDINATION OF CARE: 10:52 PM Discussed treatment plan with pt. Pt acknowledges and agrees to plan.   Labs Review Labs Reviewed  BASIC METABOLIC PANEL - Abnormal; Notable for the following:    Potassium 3.4 (*)    Glucose, Bld 162 (*)    All other components within normal limits  CBC WITH DIFFERENTIAL/PLATELET - Abnormal; Notable for the following:    MCHC 36.3 (*)    All other components within normal limits   Results for orders placed or performed during the hospital encounter of 02/21/15  Basic metabolic  panel  Result Value Ref Range   Sodium 137 135 - 145 mmol/L   Potassium 3.4 (L) 3.5 - 5.1 mmol/L   Chloride 102 101 - 111 mmol/L   CO2 24 22 - 32 mmol/L   Glucose, Bld 162 (H) 65 - 99 mg/dL   BUN 17 6 - 20 mg/dL   Creatinine, Ser 1.610.72 0.44 - 1.00 mg/dL   Calcium 8.9 8.9 - 09.610.3 mg/dL   GFR calc non Af Amer >60 >60 mL/min   GFR calc Af Amer >60 >60 mL/min   Anion gap 11 5 - 15  CBC with Differential/Platelet  Result Value Ref Range   WBC 7.3 4.0 - 10.5 K/uL   RBC 4.50 3.87 - 5.11 MIL/uL   Hemoglobin 14.6 12.0 - 15.0 g/dL   HCT 04.540.2 40.936.0 - 81.146.0 %   MCV 89.3 78.0 - 100.0 fL   MCH 32.4  26.0 - 34.0 pg   MCHC 36.3 (H) 30.0 - 36.0 g/dL   RDW 16.1 09.6 - 04.5 %   Platelets 187 150 - 400 K/uL   Neutrophils Relative % 56 %   Neutro Abs 4.1 1.7 - 7.7 K/uL   Lymphocytes Relative 35 %   Lymphs Abs 2.6 0.7 - 4.0 K/uL   Monocytes Relative 7 %   Monocytes Absolute 0.5 0.1 - 1.0 K/uL   Eosinophils Relative 2 %   Eosinophils Absolute 0.1 0.0 - 0.7 K/uL   Basophils Relative 0 %   Basophils Absolute 0.0 0.0 - 0.1 K/uL    I have personally reviewed and evaluated these lab results as part of my medical decision-making.   MDM   Final diagnoses:  Allergic reaction, initial encounter    Patient with known history of allergic reactions. The exact cause unknown. Patient started to break out while at work. With erythema and itching. Mild tightness in the throat and tongue. Patient received side Medrol Benadryl and Pepcid with improvement. No wheezing no further tongue swelling. Patient also has EpiPen at home. Will continue on prednisone Benadryl and Pepcid. Patient has EpiPen at home and will use as needed.  I personally performed the services described in this documentation, which was scribed in my presence. The recorded information has been reviewed and is accurate.      Vanetta Mulders, MD 02/21/15 [phone]

## 2015-02-21 NOTE — Discharge Instructions (Signed)
Take the prednisone as directed for the next 5 days. Continue taking Benadryl every 6 hours for the next 2 days. Take Pepcid for the next 7 days. Usually EpiPen as needed. Return for any new or worse symptoms.

## 2015-02-25 ENCOUNTER — Ambulatory Visit (INDEPENDENT_AMBULATORY_CARE_PROVIDER_SITE_OTHER): Payer: 59 | Admitting: Physician Assistant

## 2015-02-25 ENCOUNTER — Encounter: Payer: Self-pay | Admitting: Physician Assistant

## 2015-02-25 VITALS — BP 136/90 | HR 75 | Temp 97.8°F | Resp 16 | Ht 67.0 in | Wt 237.2 lb

## 2015-02-25 DIAGNOSIS — G44209 Tension-type headache, unspecified, not intractable: Secondary | ICD-10-CM | POA: Insufficient documentation

## 2015-02-25 DIAGNOSIS — T7840XA Allergy, unspecified, initial encounter: Secondary | ICD-10-CM | POA: Diagnosis not present

## 2015-02-25 MED ORDER — TIZANIDINE HCL 4 MG PO TABS: 4.0000 mg | ORAL_TABLET | Freq: Four times a day (QID) | ORAL | Status: DC | PRN

## 2015-02-25 NOTE — Assessment & Plan Note (Signed)
Continue [employer] Hot. Supportive measures reviewed. Discussed posture as a component. Rx Zanaflex.

## 2015-02-25 NOTE — Assessment & Plan Note (Signed)
To known trigger. Avoidance discussed. Patient with history of severe allergic reactions previously. Encouraged her to follow-up with Allergy. Start Pepcid twice daily as directed. Continue Benadryl each PM.

## 2015-02-25 NOTE — Progress Notes (Signed)
Patient presents to clinic today for ER follow-up of allergic reaction. Patient with previous allergic reactions requiring ER intervention. Has been to allergist but preliminary allergy panels have been negative. Patient states the only thing potentially triggering this was a new lotion she had been exposed to. Endorses whelps and facial swelling and SOB. 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   Patient also complains of tension in neck and upper back over the past week culminating in headaches. Patient denies trauma or injury. Endorses use of Icy Hot/Aspercreme to help alleviate symptoms.  Past Medical History  Diagnosis Date  . PCOS (polycystic ovarian syndrome)   . Gallstones   . Diabetes mellitus type II, controlled ([place])   . Diabetic neuropathy ([place])   . History of colonic polyps     Current Outpatient Prescriptions on File Prior to Visit  Medication Sig Dispense Refill  . dicyclomine (BENTYL) 10 MG capsule Take 10 mg by mouth 4 (four) times daily -  before meals and at bedtime. As Needed    . diphenhydrAMINE (SOMINEX) 25 MG tablet Take 25 mg by mouth at bedtime as needed. Benadryl    . EPINEPHrine 0.3 mg/0.3 mL IJ SOAJ injection Inject 0.3 mLs (0.3 mg total) into the muscle once. 1 Device 0  . famotidine (PEPCID) 20 MG tablet Take 1 tablet (20 mg total) by mouth 2 (two) times daily. 30 tablet 0  . gabapentin (NEURONTIN) 100 MG capsule Take 2 capsules (200 mg total) by mouth at bedtime. 60 capsule 3  . HYDROcodone-acetaminophen (NORCO/VICODIN) 5-325 MG per tablet Take 1 tablet by mouth every 4 (four) hours as needed for moderate pain. 60 tablet 0  . lidocaine (XYLOCAINE) 5 % ointment Apply 1 application topically at bedtime as needed. 35.44 g 3  . predniSONE (DELTASONE) 10 MG tablet Take 4 tablets (40 mg total) by mouth daily. (Patient not taking: Reported on 02/25/2015) 20 tablet 0   No current facility-administered medications on file prior to visit.     Allergies  Allergen Reactions  . Plan B [Levonorgestrel] Anaphylaxis  . Progesterone Anaphylaxis  . Mobic [Meloxicam] Rash  . Pineapple Palpitations and Rash    Family History  Problem Relation Age of Onset  . Diabetes Mother     Living  . Hypertension Mother   . Uterine cancer Mother     Pre-cancerous  . Thyroid disease Mother   . Diabetes type II Father     Living  . Hypertension Father   . Gout Father   . Neuropathy Father   . Breast cancer Maternal Grandmother   . Heart attack Paternal Grandfather   . Colon cancer Paternal Grandfather   . Colon cancer Maternal Grandfather   . Heart defect Brother   . Prostate cancer Maternal Uncle   . Alcoholism Other     Paternal Side  . Diabetes Maternal Aunt   . Diabetes Maternal Uncle     Social History   Social History  . Marital Status: Married    Spouse Name: N/A  . Number of Children: N/A  . Years of Education: N/A   Social History Main Topics  . Smoking status: Never Smoker   . Smokeless tobacco: Never Used  . Alcohol Use: 0.0 oz/week    0 Standard drinks or equivalent per week     Comment: 1 glass of wine a month  . Drug Use: No  . Sexual Activity:    Partners: Male   Other Topics Concern  .  Not on file   Social History Narrative   Lives with husband in a one story home.  No children.     Works as an Public relations account executive.     Education: associates degree.   Review of Systems - See HPI.  All other ROS are negative.  BP 136/90 mmHg  Pulse 75  Temp(Src) 97.8 F (36.6 C) (Oral)  Resp 16  Ht '5\' 7"'  (1.702 m)  Wt 237 lb 4 oz (107.616 kg)  BMI 37.15 kg/m2  SpO2 100%  LMP 02/09/2015  Physical Exam  Constitutional: She is oriented to person, place, and time and well-developed, well-nourished, and in no distress.  HENT:  Head: Normocephalic and atraumatic.  Eyes: Conjunctivae are normal.  Neck: Muscular tenderness present. No spinous process tenderness present. Normal range of motion present.  Cardiovascular:  Normal rate, regular rhythm, normal heart sounds and intact distal pulses.   Pulmonary/Chest: Effort normal and breath sounds normal. No respiratory distress. She has no wheezes. She has no rales. She exhibits no tenderness.  Neurological: She is alert and oriented to person, place, and time.  Skin: Skin is warm and dry. No rash noted.  Psychiatric: Affect normal.  Vitals reviewed.   Recent Results (from the past 2160 hour(s))  Alpha-Gal Panel     Status: None   Collection Time: 02/01/15  2:53 PM  Result Value Ref Range   Beef (Bos spp) IgE <0.10 <0.35 kU/L   Class Interpretation 0    Lamb/Mutton (Ovis spp) IgE <0.10 <0.35 kU/L   Class Interpretation 0    Pork (Sus spp) IgE <0.10 <0.35 kU/L   Class Interpretation 0     Comment: The test method is the [employer] ImmunoCAP allergen-specific IgE system. CLASS INTERPRETATION   <0.10 kU/L= 0, Negative; 0.10 - 0.34 kU/L= 0/1, Equivocal/Borderline;  0.35 - 0.69 kU/L=1, Low Positive; 0.70 - 3.49 kU/L=2, Moderate Positive;  3.50  - 17.49 kU/L=3, High Positive; 17.50 - 49.99 kU/L= 4, Very High Positive; 50.00 - 99.99  kU/L= 5, Very High Positive;   >99.99 kU/L=6, Very High Positive *This test was developed and its performance characteristics determined by [facility]. It has not been cleared or approved by the U.S. Food and Drug Administration.    Alpha Gal IgE* <0.10 <0.35 kU/L    Comment: Previous reports (JACI [id number]) have demonstrated that patients with IgE antibodies to galactose-a-1,3-galactose are at risk for delayed anaphylaxis, angioedema, or urticaria following consumption of beef, pork, or lamb.   Pineapple, IgE     Status: None   Collection Time: 02/01/15  2:54 PM  Result Value Ref Range   Pineapple IgE <0.10 Class 0 kU/L    Comment:     Levels of Specific IgE       Class  Description of Class     ---------------------------  -----  --------------------                    < 0.10         0          Negative            0.10 -    0.31         0/I       Equivocal/Low            0.32 -    0.55         I         Low  0.56 -    1.40         II        Moderate            1.41 -    3.90         III       High            3.91 -   19.00         IV        Very High           19.01 -  100.00         V         Very High                   >100.00         VI        Very High   Basic metabolic panel     Status: Abnormal   Collection Time: 02/21/15  7:45 PM  Result Value Ref Range   Sodium 137 135 - 145 mmol/L   Potassium 3.4 (L) 3.5 - 5.1 mmol/L   Chloride 102 101 - 111 mmol/L   CO2 24 22 - 32 mmol/L   Glucose, Bld 162 (H) 65 - 99 mg/dL   BUN 17 6 - 20 mg/dL   Creatinine, Ser 0.72 0.44 - 1.00 mg/dL   Calcium 8.9 8.9 - 10.3 mg/dL   GFR calc non Af Amer >60 >60 mL/min   GFR calc Af Amer >60 >60 mL/min    Comment: (NOTE) The eGFR has been calculated using the CKD EPI equation. This calculation has not been validated in all clinical situations. eGFR's persistently <60 mL/min signify possible Chronic Kidney Disease.    Anion gap 11 5 - 15  CBC with Differential/Platelet     Status: Abnormal   Collection Time: 02/21/15  7:45 PM  Result Value Ref Range   WBC 7.3 4.0 - 10.5 K/uL   RBC 4.50 3.87 - 5.11 MIL/uL   Hemoglobin 14.6 12.0 - 15.0 g/dL   HCT 40.2 36.0 - 46.0 %   MCV 89.3 78.0 - 100.0 fL   MCH 32.4 26.0 - 34.0 pg   MCHC 36.3 (H) 30.0 - 36.0 g/dL   RDW 11.8 11.5 - 15.5 %   Platelets 187 150 - 400 K/uL   Neutrophils Relative % 56 %   Neutro Abs 4.1 1.7 - 7.7 K/uL   Lymphocytes Relative 35 %   Lymphs Abs 2.6 0.7 - 4.0 K/uL   Monocytes Relative 7 %   Monocytes Absolute 0.5 0.1 - 1.0 K/uL   Eosinophils Relative 2 %   Eosinophils Absolute 0.1 0.0 - 0.7 K/uL   Basophils Relative 0 %   Basophils Absolute 0.0 0.0 - 0.1 K/uL   Assessment/Plan: Allergic reaction To known trigger. Avoidance discussed. Patient with history of severe allergic reactions previously. Encouraged her  to follow-up with Allergy. Start Pepcid twice daily as directed. Continue Benadryl each PM.  Tension headache Continue Icy Hot. Supportive measures reviewed. Discussed posture as a component. Rx Zanaflex.

## 2015-02-25 NOTE — Patient Instructions (Signed)
Please take the Zanaflex as directed to help with tension headaches. Continue your [facility] as directed since you tolerate these medications. Alternate ice and heat to the area.  Make sure to schedule a follow-up with your Allergist due to recent reaction.  Make sure to take the Pepcid twice daily.

## 2015-02-25 NOTE — Progress Notes (Signed)
Pre visit review using our clinic review tool, if applicable. No additional management support is needed unless otherwise documented below in the visit note/SLS  

## 2015-03-03 ENCOUNTER — Telehealth: Payer: Self-pay | Admitting: Physician Assistant

## 2015-03-03 NOTE — Telephone Encounter (Signed)
Noted in EMR. 

## 2015-03-03 NOTE — Telephone Encounter (Signed)
Patient had Flu Shot in November at work. She works downstairs in the ER

## 2015-03-08 ENCOUNTER — Encounter: Payer: Self-pay | Admitting: Physician Assistant

## 2015-03-08 ENCOUNTER — Ambulatory Visit (INDEPENDENT_AMBULATORY_CARE_PROVIDER_SITE_OTHER): Payer: 59 | Admitting: Physician Assistant

## 2015-03-08 VITALS — BP 128/80 | HR 90 | Temp 98.2°F | Ht 67.0 in | Wt 239.6 lb

## 2015-03-08 DIAGNOSIS — J028 Acute pharyngitis due to other specified organisms: Secondary | ICD-10-CM

## 2015-03-08 DIAGNOSIS — E119 Type 2 diabetes mellitus without complications: Secondary | ICD-10-CM

## 2015-03-08 DIAGNOSIS — J029 Acute pharyngitis, unspecified: Secondary | ICD-10-CM

## 2015-03-08 DIAGNOSIS — B9789 Other viral agents as the cause of diseases classified elsewhere: Secondary | ICD-10-CM

## 2015-03-08 LAB — BASIC METABOLIC PANEL
BUN: 12 mg/dL (ref 6–23)
CALCIUM: 9.2 mg/dL (ref 8.4–10.5)
CO2: 23 mEq/L (ref 19–32)
CREATININE: 0.67 mg/dL (ref 0.40–1.20)
Chloride: 102 mEq/L (ref 96–112)
GFR: 112.12 mL/min (ref >60.00)
Glucose, Bld: 205 mg/dL — ABNORMAL HIGH (ref 70–99)
Potassium: 4 mEq/L (ref 3.5–5.1)
Sodium: 137 mEq/L (ref 135–145)

## 2015-03-08 LAB — HEMOGLOBIN A1C: HEMOGLOBIN A1C: 7.2 % — AB (ref 4.6–6.5)

## 2015-03-08 LAB — MICROALBUMIN / CREATININE URINE RATIO
Creatinine,U: 250 mg/dL
MICROALB UR: 4.4 mg/dL — AB (ref 0.0–1.9)
Microalb Creat Ratio: 1.8 mg/g (ref 0.0–30.0)

## 2015-03-08 LAB — VITAMIN B12: VITAMIN B 12: 420 pg/mL (ref 211–911)

## 2015-03-08 NOTE — Assessment & Plan Note (Signed)
Foot exam updated and within normal limits. Will check BMP, A1C and urine microalbumin. Continue gabapentin. Follow-up with Neurology as scheduled.

## 2015-03-08 NOTE — Assessment & Plan Note (Signed)
Exam within normal limits. Suspect viral and worsened with PND. Supportive measures reviewed. Hydration is key. Follow-up if not resolving.

## 2015-03-08 NOTE — Patient Instructions (Signed)
Please go to the lab for blood work. I will call you with your results.  Continue medications as directed.  Follow-up 6 months for your annual exam.

## 2015-03-08 NOTE — Progress Notes (Signed)
Pre visit review using our clinic review tool, if applicable. No additional management support is needed unless otherwise documented below in the visit note. 

## 2015-03-08 NOTE — Progress Notes (Signed)
Patient presents to clinic today for follow-up of Diabetes Mellitus II, previously controlled with diet.  Last A1C in June at 6.6. Patient continues to watch her diet and stay active. Is taking her gabapentin with relief of neuropathy symptoms. Is due for foot exam and repeat labs.  Patient c/o mild scratchy throat x 2 days with PND. Denies fever, chills, swollen glands or other URI symptoms. Has not taken anything for symptoms.    Past Medical History  Diagnosis Date  . PCOS (polycystic ovarian syndrome)   . Gallstones   . Diabetes mellitus type II, controlled ([place])   . Diabetic neuropathy ([place])   . History of colonic polyps     Current Outpatient Prescriptions on File Prior to Visit  Medication Sig Dispense Refill  . dicyclomine (BENTYL) 10 MG capsule Take 10 mg by mouth 4 (four) times daily -  before meals and at bedtime. As Needed    . diphenhydrAMINE (SOMINEX) 25 MG tablet Take 25 mg by mouth at bedtime as needed. Benadryl    . EPINEPHrine 0.3 mg/0.3 mL IJ SOAJ injection Inject 0.3 mLs (0.3 mg total) into the muscle once. 1 Device 0  . famotidine (PEPCID) 20 MG tablet Take 1 tablet (20 mg total) by mouth 2 (two) times daily. 30 tablet 0  . gabapentin (NEURONTIN) 100 MG capsule Take 2 capsules (200 mg total) by mouth at bedtime. 60 capsule 3  . HYDROcodone-acetaminophen (NORCO/VICODIN) 5-325 MG per tablet Take 1 tablet by mouth every 4 (four) hours as needed for moderate pain. 60 tablet 0  . lidocaine (XYLOCAINE) 5 % ointment Apply 1 application topically at bedtime as needed. 35.44 g 3  . Prenatal Vit-Fe Fumarate-FA (PRENATAL VITAMIN) 27-0.8 MG TABS Take by mouth daily.    Marland Kitchen tiZANidine (ZANAFLEX) 4 MG tablet Take 1 tablet (4 mg total) by mouth every 6 (six) hours as needed for muscle spasms. 60 tablet 0   No current facility-administered medications on file prior to visit.    Allergies  Allergen Reactions  . Plan B [Levonorgestrel] Anaphylaxis  . Progesterone Anaphylaxis    . Mobic [Meloxicam] Rash  . Pineapple Palpitations and Rash    Family History  Problem Relation Age of Onset  . Diabetes Mother     Living  . Hypertension Mother   . Uterine cancer Mother     Pre-cancerous  . Thyroid disease Mother   . Diabetes type II Father     Living  . Hypertension Father   . Gout Father   . Neuropathy Father   . Breast cancer Maternal Grandmother   . Heart attack Paternal Grandfather   . Colon cancer Paternal Grandfather   . Colon cancer Maternal Grandfather   . Heart defect Brother   . Prostate cancer Maternal Uncle   . Alcoholism Other     Paternal Side  . Diabetes Maternal Aunt   . Diabetes Maternal Uncle     Social History   Social History  . Marital Status: Married    Spouse Name: N/A  . Number of Children: N/A  . Years of Education: N/A   Social History Main Topics  . Smoking status: Never Smoker   . Smokeless tobacco: Never Used  . Alcohol Use: 0.0 oz/week    0 Standard drinks or equivalent per week     Comment: 1 glass of wine a month  . Drug Use: No  . Sexual Activity:    Partners: Male   Other Topics Concern  .  None   Social History Narrative   Lives with husband in a one story home.  No children.     Works as an Public relations account executive.     Education: associates degree.    Review of Systems - See HPI.  All other ROS are negative.  BP 128/80 mmHg  Pulse 90  Temp(Src) 98.2 F (36.8 C) (Oral)  Ht 5' 7" (1.702 m)  Wt 239 lb 9.6 oz (108.682 kg)  BMI 37.52 kg/m2  SpO2 98%  LMP 02/09/2015  Physical Exam  Constitutional: She is oriented to person, place, and time and well-developed, well-nourished, and in no distress.  HENT:  Head: Normocephalic and atraumatic.  Right Ear: External ear normal.  Left Ear: External ear normal.  Nose: Nose normal.  Mouth/Throat: Oropharynx is clear and moist. No oropharyngeal exudate.  TM within normal limits bilaterally  Eyes: Conjunctivae are normal.  Neck: Neck supple.  Cardiovascular: Normal rate,  regular rhythm, normal heart sounds and intact distal pulses.   Pulmonary/Chest: Effort normal and breath sounds normal. No respiratory distress. She has no wheezes. She has no rales. She exhibits no tenderness.  Lymphadenopathy:    She has no cervical adenopathy.  Neurological: She is alert and oriented to person, place, and time.  Skin: Skin is warm and dry. No rash noted.  Psychiatric: Affect normal.  Vitals reviewed.  Diabetic Foot Form - Detailed   Diabetic Foot Exam - detailed  Diabetic Foot exam was performed with the following findings:  Yes 03/08/2015  9:51 AM  Visual Foot Exam completed.:  Yes  Is there a history of foot ulcer?:  No  Can the patient see the bottom of their feet?:  Yes  Are the shoes appropriate in style and fit?:  Yes  Is there swelling or and abnormal foot shape?:  No  Are the toenails long?:  No  Are the toenails thick?:  No  Do you have pain in calf while walking?:  No  Is there a claw toe deformity?:  No  Is there elevated skin temparature?:  No  Is there limited skin dorsiflexion?:  No  Is there foot or ankle muscle weakness?:  No  Are the toenails ingrown?:  No  Normal Range of Motion:  Yes    Pulse Foot Exam completed.:  Yes  Right posterior Tibialias:  Present Left posterior Tibialias:  Present  Right Dorsalis Pedis:  Present Left Dorsalis Pedis:  Present  Sensory Foot Exam Completed.:  Yes  Swelling:  No  Semmes-Weinstein Monofilament Test  R Foot Test Control:  Neg L Foot Test Control:  Neg  R Site 1-Great Toe:  Neg L Site 1-Great Toe:  Neg  R Site 4:  Neg L Site 4:  Neg  R Site 5:  Neg L Site 5:  Neg        Recent Results (from the past 2160 hour(s))  Alpha-Gal Panel     Status: None   Collection Time: 02/01/15  2:53 PM  Result Value Ref Range   Beef (Bos spp) IgE <0.10 <0.35 kU/L   Class Interpretation 0    Lamb/Mutton (Ovis spp) IgE <0.10 <0.35 kU/L   Class Interpretation 0    Pork (Sus spp) IgE <0.10 <0.35 kU/L   Class  Interpretation 0     Comment: The test method is the [employer] ImmunoCAP allergen-specific IgE system. CLASS INTERPRETATION   <0.10 kU/L= 0, Negative; 0.10 - 0.34 kU/L= 0/1, Equivocal/Borderline;  0.35 - 0.69 kU/L=1, Low Positive; 0.70 - 3.49  kU/L=2, Moderate Positive;  3.50  - 17.49 kU/L=3, High Positive; 17.50 - 49.99 kU/L= 4, Very High Positive; 50.00 - 99.99  kU/L= 5, Very High Positive;   >99.99 kU/L=6, Very High Positive *This test was developed and its performance characteristics determined by [facility]. It has not been cleared or approved by the U.S. Food and Drug Administration.    Alpha Gal IgE* <0.10 <0.35 kU/L    Comment: Previous reports (JACI [id number]) have demonstrated that patients with IgE antibodies to galactose-a-1,3-galactose are at risk for delayed anaphylaxis, angioedema, or urticaria following consumption of beef, pork, or lamb.   Pineapple, IgE     Status: None   Collection Time: 02/01/15  2:54 PM  Result Value Ref Range   Pineapple IgE <0.10 Class 0 kU/L    Comment:     Levels of Specific IgE       Class  Description of Class     ---------------------------  -----  --------------------                    < 0.10         0         Negative            0.10 -    0.31         0/I       Equivocal/Low            0.32 -    0.55         I         Low            0.56 -    1.40         II        Moderate            1.41 -    3.90         III       High            3.91 -   19.00         IV        Very High           19.01 -  100.00         V         Very High                   >100.00         VI        Very High   Basic metabolic panel     Status: Abnormal   Collection Time: 02/21/15  7:45 PM  Result Value Ref Range   Sodium 137 135 - 145 mmol/L   Potassium 3.4 (L) 3.5 - 5.1 mmol/L   Chloride 102 101 - 111 mmol/L   CO2 24 22 - 32 mmol/L   Glucose, Bld 162 (H) 65 - 99 mg/dL   BUN 17 6 - 20 mg/dL   Creatinine, Ser 0.72 0.44 - 1.00 mg/dL    Calcium 8.9 8.9 - 10.3 mg/dL   GFR calc non Af Amer >60 >60 mL/min   GFR calc Af Amer >60 >60 mL/min    Comment: (NOTE) The eGFR has been calculated using the CKD EPI equation. This calculation has not been validated in all clinical situations. eGFR's persistently <60 mL/min signify possible Chronic Kidney Disease.    Anion gap 11 5 - 15  CBC with Differential/Platelet     Status: Abnormal   Collection Time: 02/21/15  7:45 PM  Result Value Ref Range   WBC 7.3 4.0 - 10.5 K/uL   RBC 4.50 3.87 - 5.11 MIL/uL   Hemoglobin 14.6 12.0 - 15.0 g/dL   HCT 40.2 36.0 - 46.0 %   MCV 89.3 78.0 - 100.0 fL   MCH 32.4 26.0 - 34.0 pg   MCHC 36.3 (H) 30.0 - 36.0 g/dL   RDW 11.8 11.5 - 15.5 %   Platelets 187 150 - 400 K/uL   Neutrophils Relative % 56 %   Neutro Abs 4.1 1.7 - 7.7 K/uL   Lymphocytes Relative 35 %   Lymphs Abs 2.6 0.7 - 4.0 K/uL   Monocytes Relative 7 %   Monocytes Absolute 0.5 0.1 - 1.0 K/uL   Eosinophils Relative 2 %   Eosinophils Absolute 0.1 0.0 - 0.7 K/uL   Basophils Relative 0 %   Basophils Absolute 0.0 0.0 - 0.1 K/uL    Assessment/Plan: Diabetes mellitus type II, controlled Foot exam updated and within normal limits. Will check BMP, A1C and urine microalbumin. Continue gabapentin. Follow-up with Neurology as scheduled.  Sore throat (viral) Exam within normal limits. Suspect viral and worsened with PND. Supportive measures reviewed. Hydration is key. Follow-up if not resolving.

## 2015-03-11 ENCOUNTER — Encounter: Payer: Self-pay | Admitting: Physician Assistant

## 2015-03-22 ENCOUNTER — Encounter: Payer: Self-pay | Admitting: Physician Assistant

## 2015-04-14 MED FILL — GABAPENTIN 100 MG CAPSULE: 100 | 30 days supply | Qty: 60 | Fill #3

## 2015-04-15 DIAGNOSIS — N926 Irregular menstruation, unspecified: Secondary | ICD-10-CM | POA: Diagnosis not present

## 2015-04-15 DIAGNOSIS — Z3200 Encounter for pregnancy test, result unknown: Secondary | ICD-10-CM | POA: Diagnosis not present

## 2015-04-15 MED FILL — LETROZOLE 2.5 MG TABLET: 2.5 | 28 days supply | Qty: 10 | Fill #0

## 2015-04-18 ENCOUNTER — Institutional Professional Consult (permissible substitution): Payer: Self-pay | Admitting: Internal Medicine

## 2015-05-09 DIAGNOSIS — N926 Irregular menstruation, unspecified: Secondary | ICD-10-CM | POA: Diagnosis not present

## 2015-05-13 ENCOUNTER — Emergency Department (INDEPENDENT_AMBULATORY_CARE_PROVIDER_SITE_OTHER)
Admission: EM | Admit: 2015-05-13 | Discharge: 2015-05-13 | Disposition: A | Discharge status: Home / Self Care | Payer: 59 | Source: Home / Self Care | Attending: Family Medicine | Admitting: Family Medicine

## 2015-05-13 ENCOUNTER — Encounter: Payer: Self-pay | Admitting: *Deleted

## 2015-05-13 DIAGNOSIS — J111 Influenza due to unidentified influenza virus with other respiratory manifestations: Secondary | ICD-10-CM | POA: Diagnosis not present

## 2015-05-13 DIAGNOSIS — R69 Illness, unspecified: Principal | ICD-10-CM

## 2015-05-13 MED ORDER — OSELTAMIVIR PHOSPHATE 75 MG PO CAPS: 75.0000 mg | ORAL_CAPSULE | Freq: Two times a day (BID) | ORAL | Status: DC

## 2015-05-13 MED FILL — OSELTAMIVIR PHOS 75 MG CAP: 75 | 5 days supply | Qty: 10 | Fill #0

## 2015-05-13 NOTE — ED Notes (Signed)
Pt c/o fatigue, aches, chills, dry cough that started yesterday. Monday had a low grade fever that resolved.

## 2015-05-13 NOTE — Discharge Instructions (Signed)
Take plain guaifenesin (  extended release tabs such as Mucinex) twice daily, with plenty of water, for cough and congestion.  May add Pseudoephedrine ( , one or two every 4 to 6 hours) for sinus congestion.  Get adequate rest.   May use Afrin nasal spray (or generic oxymetazoline) twice daily for about 5 days and then discontinue.  Also recommend using saline nasal spray several times daily and saline nasal irrigation (AYR is a common brand).   Try warm salt water gargles for sore throat.  Stop all other non-prescription cough/cold preparations. May take Ibuprofen , 4 tabs every 8 hours with food for chest/sternum discomfort, fever, headache, body aches, etc. May take Delsym Cough Suppressant at bedtime for nighttime cough.  Follow-up with family doctor if not improving about one week.   Influenza, Adult Influenza ("the flu") is a viral infection of the respiratory tract. It occurs more often in winter months because people spend more time in close contact with one another. Influenza can make you feel very sick. Influenza easily spreads from person to person (contagious). CAUSES  Influenza is caused by a virus that infects the respiratory tract. You can catch the virus by breathing in droplets from an infected person's cough or sneeze. You can also catch the virus by touching something that was recently contaminated with the virus and then touching your mouth, nose, or eyes. RISKS AND COMPLICATIONS You may be at risk for a more severe case of influenza if you smoke cigarettes, have diabetes, have chronic heart disease (such as heart failure) or lung disease (such as asthma), or if you have a weakened immune system. Elderly people and pregnant women are also at risk for more serious infections. The most common problem of influenza is a lung infection (pneumonia). Sometimes, this problem can require emergency medical care and may be life threatening. SIGNS AND SYMPTOMS  Symptoms typically  last 4 to 10 days and may include:  Fever.  Chills.  Headache, body aches, and muscle aches.  Sore throat.  Chest discomfort and cough.  Poor appetite.  Weakness or feeling tired.  Dizziness.  Nausea or vomiting. DIAGNOSIS  Diagnosis of influenza is often made based on your history and a physical exam. A nose or throat swab test can be done to confirm the diagnosis. TREATMENT  In mild cases, influenza goes away on its own. Treatment is directed at relieving symptoms. For more severe cases, your health care provider may prescribe antiviral medicines to shorten the sickness. Antibiotic medicines are not effective because the infection is caused by a virus, not by bacteria. HOME CARE INSTRUCTIONS  Take medicines only as directed by your health care provider.  Use a cool mist humidifier to make breathing easier.  Get plenty of rest until your temperature returns to normal. This usually takes 3 to 4 days.  Drink enough fluid to keep your urine clear or pale yellow.  Cover yourmouth and nosewhen coughing or sneezing,and wash your handswellto prevent thevirusfrom spreading.  Stay homefromwork orschool untilthe fever is gonefor at least 61full day. PREVENTION  An annual influenza vaccination (flu shot) is the best way to avoid getting influenza. An annual flu shot is now routinely recommended for all adults in the [place] SEEK MEDICAL CARE IF:  You experiencechest pain, yourcough worsens,or you producemore mucus.  Youhave nausea,vomiting, ordiarrhea.  Your fever returns or gets worse. SEEK IMMEDIATE MEDICAL CARE IF:  You havetrouble breathing, you become short of breath,or your skin ornails becomebluish.  You have severe painor stiffnessin  the neck.  You develop a sudden headache, or pain in the face or ear.  You have nausea or vomiting that you cannot control. MAKE SURE YOU:   Understand these instructions.  Will watch your condition.  Will  get help right away if you are not doing well or get worse.   This information is not intended to replace advice given to you by your health care provider. Make sure you discuss any questions you have with your health care provider.   Document Released: 03/09/2000 Document Revised: 04/02/2014 Document Reviewed: 06/11/2011 Elsevier Interactive Patient Education [employer]

## 2015-05-13 NOTE — ED Provider Notes (Signed)
CSN: [id number]     Arrival date & time 05/13/15  1221 History   First MD Initiated Contact with Patient 05/13/15 1240     Chief Complaint  Patient presents with  . Generalized Body Aches  . Chills  . Cough     HPI Comments: Complains of 1 day history flu-like illness including myalgias, fever/chills, fatigue, and cough.  Also has mild nasal congestion and sore throat.  Cough is non-productive and somewhat worse at night.  No pleuritic pain or shortness of breath.     The history is provided by the patient.    Past Medical History  Diagnosis Date  . PCOS (polycystic ovarian syndrome)   . Gallstones   . Diabetes mellitus type II, controlled (HCC)   . Diabetic neuropathy (HCC)   . History of colonic polyps    Past Surgical History  Procedure Laterality Date  . Cholecystectomy  2014  . Colonoscopy  2005   Family History  Problem Relation Age of Onset  . Diabetes Mother     Living  . Hypertension Mother   . Uterine cancer Mother     Pre-cancerous  . Thyroid disease Mother   . Diabetes type II Father     Living  . Hypertension Father   . Gout Father   . Neuropathy Father   . Breast cancer Maternal Grandmother   . Heart attack Paternal Grandfather   . Colon cancer Paternal Grandfather   . Colon cancer Maternal Grandfather   . Heart defect Brother   . Prostate cancer Maternal Uncle   . Alcoholism Other     Paternal Side  . Diabetes Maternal Aunt   . Diabetes Maternal Uncle    Social History  Substance Use Topics  . Smoking status: Never Smoker   . Smokeless tobacco: Never Used  . Alcohol Use: 0.0 oz/week    0 Standard drinks or equivalent per week     Comment: 1 glass of wine a month   OB History    No data available     Review of Systems + sore throat + cough No pleuritic pain No wheezing + nasal congestion + post-nasal drainage No sinus pain/pressure No itchy/red eyes ? earache No hemoptysis No SOB No fever, + chills No nausea No vomiting No  abdominal pain No diarrhea No urinary symptoms No skin rash + fatigue + myalgias No headache Used OTC meds without relief  Allergies  Plan b; Progesterone; Mobic; and Pineapple  Home Medications   Prior to Admission medications   Medication Sig Start Date End Date Taking? Authorizing Provider  diphenhydrAMINE (SOMINEX) 25 MG tablet Take 25 mg by mouth at bedtime as needed. Benadryl   Yes Historical Provider, MD  gabapentin (NEURONTIN) 100 MG capsule Take 2 capsules (200 mg total) by mouth at bedtime. 11/29/14  Yes Waldon Merl, PA-C  Prenatal Vit-Fe Fumarate-FA (PRENATAL VITAMIN) 27-0.8 MG TABS Take by mouth daily.   Yes Historical Provider, MD  dicyclomine (BENTYL) 10 MG capsule Take 10 mg by mouth 4 (four) times daily -  before meals and at bedtime. As Needed    Historical Provider, MD  EPINEPHrine 0.3 mg/0.3 mL IJ SOAJ injection Inject 0.3 mLs (0.3 mg total) into the muscle once. 05/31/14   Waldon Merl, PA-C  lidocaine (XYLOCAINE) 5 % ointment Apply 1 application topically at bedtime as needed. 05/21/14   Donika Concha Se, DO  oseltamivir (TAMIFLU) 75 MG capsule Take 1 capsule (75 mg total) by mouth every 12 (  twelve) hours. 05/13/15   Lattie Haw, MD  tiZANidine (ZANAFLEX) 4 MG tablet Take 1 tablet (4 mg total) by mouth every 6 (six) hours as needed for muscle spasms. 02/25/15   Waldon Merl, PA-C   Meds Ordered and Administered this Visit  Medications - No data to display  BP 156/101 mmHg  Pulse 87  Temp(Src) 98.2 F (36.8 C) (Oral)  Resp 16  Wt 242 lb (109.77 kg)  SpO2 98%  LMP 04/19/2015 No data found.   Physical Exam Nursing notes and Vital Signs reviewed. Appearance:  Patient appears stated age, and in no acute distress Eyes:  Pupils are equal, round, and reactive to light and accomodation.  Extraocular movement is intact.  Conjunctivae are not inflamed  Ears:  Canals normal.  Tympanic membranes normal.  Nose:  Mildly congested turbinates.  No sinus  tenderness.    Pharynx:  Normal Neck:  Supple.  Tender enlarged posterior nodes are palpated bilaterally  Lungs:  Clear to auscultation.  Breath sounds are equal.  Moving air well. Heart:  Regular rate and rhythm without murmurs, rubs, or gallops.  Abdomen:  Nontender without masses or hepatosplenomegaly.  Bowel sounds are present.  No CVA or flank tenderness.  Extremities:  No edema.  Skin:  No rash present.   ED Course  Procedures  None    MDM   1. Influenza-like illness    Begin Tamiflu Take plain guaifenesin (  extended release tabs such as Mucinex) twice daily, with plenty of water, for cough and congestion.  May add Pseudoephedrine ( , one or two every 4 to 6 hours) for sinus congestion.  Get adequate rest.   May use Afrin nasal spray (or generic oxymetazoline) twice daily for about 5 days and then discontinue.  Also recommend using saline nasal spray several times daily and saline nasal irrigation (AYR is a common brand).   Try warm salt water gargles for sore throat.  Stop all other non-prescription cough/cold preparations. May take Ibuprofen , 4 tabs every 8 hours with food for chest/sternum discomfort, fever, headache, body aches, etc. May take Delsym Cough Suppressant at bedtime for nighttime cough.  Follow-up with family doctor if not improving about one week.    Lattie Haw, MD 05/13/15 1256

## 2015-05-17 MED FILL — LETROZOLE 2.5 MG TABLET: 2.5 | 15 days supply | Qty: 15 | Fill #0

## 2015-05-26 ENCOUNTER — Other Ambulatory Visit: Payer: Self-pay | Admitting: General Practice

## 2015-05-26 ENCOUNTER — Encounter: Payer: Self-pay | Admitting: Physician Assistant

## 2015-05-26 ENCOUNTER — Other Ambulatory Visit: Payer: Self-pay | Admitting: Physician Assistant

## 2015-05-26 DIAGNOSIS — G44209 Tension-type headache, unspecified, not intractable: Secondary | ICD-10-CM

## 2015-05-26 MED ORDER — GABAPENTIN 100 MG PO CAPS: 200.0000 mg | ORAL_CAPSULE | Freq: Every day | ORAL | Status: DC

## 2015-05-26 MED FILL — GABAPENTIN 100 MG CAPSULE: 100 | 30 days supply | Qty: 60 | Fill #0

## 2015-05-27 MED ORDER — TIZANIDINE HCL 4 MG PO TABS: 4.0000 mg | ORAL_TABLET | Freq: Four times a day (QID) | ORAL | Status: DC | PRN

## 2015-05-27 MED ORDER — GABAPENTIN 100 MG PO CAPS: 200.0000 mg | ORAL_CAPSULE | Freq: Every day | ORAL | Status: DC

## 2015-05-27 MED FILL — tiZANidine HCL 4 MG TABS: 4 | 15 days supply | Qty: 60 | Fill #0

## 2015-06-06 DIAGNOSIS — N926 Irregular menstruation, unspecified: Secondary | ICD-10-CM | POA: Diagnosis not present

## 2015-07-01 DIAGNOSIS — Z6836 Body mass index (BMI) 36.0-36.9, adult: Secondary | ICD-10-CM | POA: Diagnosis not present

## 2015-07-01 DIAGNOSIS — N926 Irregular menstruation, unspecified: Secondary | ICD-10-CM | POA: Diagnosis not present

## 2015-07-01 DIAGNOSIS — E119 Type 2 diabetes mellitus without complications: Secondary | ICD-10-CM | POA: Diagnosis not present

## 2015-07-01 MED FILL — METFORMIN HCL ER 500 MG TAB: 500 | 90 days supply | Qty: 90 | Fill #0

## 2015-07-04 DIAGNOSIS — N926 Irregular menstruation, unspecified: Secondary | ICD-10-CM | POA: Diagnosis not present

## 2015-07-04 DIAGNOSIS — N970 Female infertility associated with anovulation: Secondary | ICD-10-CM | POA: Diagnosis not present

## 2015-07-06 MED FILL — GABAPENTIN 100 MG CAPSULE: 100 | 30 days supply | Qty: 60 | Fill #0

## 2015-07-12 ENCOUNTER — Other Ambulatory Visit: Payer: Self-pay | Admitting: Physician Assistant

## 2015-07-12 ENCOUNTER — Telehealth: Payer: 59 | Admitting: Family

## 2015-07-12 ENCOUNTER — Encounter: Payer: Self-pay | Admitting: Physician Assistant

## 2015-07-12 DIAGNOSIS — T782XXA Anaphylactic shock, unspecified, initial encounter: Secondary | ICD-10-CM

## 2015-07-12 DIAGNOSIS — A499 Bacterial infection, unspecified: Secondary | ICD-10-CM | POA: Diagnosis not present

## 2015-07-12 DIAGNOSIS — J329 Chronic sinusitis, unspecified: Secondary | ICD-10-CM

## 2015-07-12 DIAGNOSIS — B9689 Other specified bacterial agents as the cause of diseases classified elsewhere: Secondary | ICD-10-CM

## 2015-07-12 MED ORDER — EPINEPHRINE 0.3 MG/0.3ML IJ SOAJ: 0.3000 mg | Freq: Once | INTRAMUSCULAR | Status: DC

## 2015-07-12 MED ORDER — CEPHALEXIN 500 MG PO CAPS: 500.0000 mg | ORAL_CAPSULE | Freq: Four times a day (QID) | ORAL | Status: DC

## 2015-07-12 MED ORDER — ONDANSETRON HCL 8 MG PO TABS: 8.0000 mg | ORAL_TABLET | Freq: Three times a day (TID) | ORAL | Status: DC | PRN

## 2015-07-12 MED FILL — CEPHALEXIN 500 MG CAPSULE: 500 | 7 days supply | Qty: 28 | Fill #0

## 2015-07-12 MED FILL — EPINEPHRINE 0.3 MG AUTO-INJ: 0.3 | 30 days supply | Qty: 2 | Fill #0

## 2015-07-12 MED FILL — ONDANSETRON HCL 8 MG TABLET: 8 | 7 days supply | Qty: 20 | Fill #0

## 2015-07-12 NOTE — Progress Notes (Signed)
We are sorry that you are not feeling well.  Here is how we plan to help!  Based on what you have shared with me it looks like you have sinusitis.  Sinusitis is inflammation and infection in the sinus cavities of the head.  Based on your presentation I believe you most likely have Acute Bacterial Sinusitis.  This is an infection caused by bacteria and is treated with antibiotics. I have prescribed Cephalexin 500 mg 4 times a day for 7 days. This is safe if you are pregnant. You may use an oral decongestant such as Mucinex D or if you have glaucoma or high blood pressure use plain Mucinex. Saline nasal spray help and can safely be used as often as needed for congestion.  If you develop worsening sinus pain, fever or notice severe headache and vision changes, or if symptoms are not better after completion of antibiotic, please schedule an appointment with a health care provider.    Sinus infections are not as easily transmitted as other respiratory infection, however we still recommend that you avoid close contact with loved ones, especially the very young and elderly.  Remember to wash your hands thoroughly throughout the day as this is the number one way to prevent the spread of infection!  Home Care:  Only take medications as instructed by your medical team.  Complete the entire course of an antibiotic.  Do not take these medications with alcohol.  A steam or ultrasonic humidifier can help congestion.  You can place a towel over your head and breathe in the steam from hot water coming from a faucet.  Avoid close contacts especially the very young and the elderly.  Cover your mouth when you cough or sneeze.  Always remember to wash your hands.  Get Help Right Away If:  You develop worsening fever or sinus pain.  You develop a severe head ache or visual changes.  Your symptoms persist after you have completed your treatment plan.  Make sure you  Understand these instructions.  Will  watch your condition.  Will get help right away if you are not doing well or get worse.  Your e-visit answers were reviewed by a board certified advanced clinical practitioner to complete your personal care plan.  Depending on the condition, your plan could have included both over the counter or prescription medications.  If there is a problem please reply  once you have received a response from your provider.  Your safety is important to us.  If you have drug allergies check your prescription carefully.    You can use MyChart to ask questions about today's visit, request a non-urgent call back, or ask for a work or school excuse for 24 hours related to this e-Visit. If it has been greater than 24 hours you will need to follow up with your provider, or enter a new e-Visit to address those concerns.  You will get an e-mail in the next two days asking about your experience.  I hope that your e-visit has been valuable and will speed your recovery. Thank you for using e-visits.

## 2015-07-19 MED FILL — LETROZOLE 2.5 MG TABLET: 2.5 | 7 days supply | Qty: 15 | Fill #0

## 2015-07-29 DIAGNOSIS — N83201 Unspecified ovarian cyst, right side: Secondary | ICD-10-CM | POA: Diagnosis not present

## 2015-08-01 DIAGNOSIS — N970 Female infertility associated with anovulation: Secondary | ICD-10-CM | POA: Diagnosis not present

## 2015-08-01 DIAGNOSIS — E282 Polycystic ovarian syndrome: Secondary | ICD-10-CM | POA: Diagnosis not present

## 2015-08-08 MED FILL — GABAPENTIN 100 MG CAPSULE: 100 | 30 days supply | Qty: 60 | Fill #1

## 2015-08-15 DIAGNOSIS — N970 Female infertility associated with anovulation: Secondary | ICD-10-CM | POA: Diagnosis not present

## 2015-08-16 ENCOUNTER — Encounter: Payer: Self-pay | Admitting: Physician Assistant

## 2015-08-17 DIAGNOSIS — Z3A00 Weeks of gestation of pregnancy not specified: Secondary | ICD-10-CM | POA: Diagnosis not present

## 2015-08-17 DIAGNOSIS — O200 Threatened abortion: Secondary | ICD-10-CM | POA: Diagnosis not present

## 2015-08-19 DIAGNOSIS — Z3A00 Weeks of gestation of pregnancy not specified: Secondary | ICD-10-CM | POA: Diagnosis not present

## 2015-08-19 DIAGNOSIS — O200 Threatened abortion: Secondary | ICD-10-CM | POA: Diagnosis not present

## 2015-08-21 DIAGNOSIS — O200 Threatened abortion: Secondary | ICD-10-CM | POA: Diagnosis not present

## 2015-08-21 DIAGNOSIS — Z3A00 Weeks of gestation of pregnancy not specified: Secondary | ICD-10-CM | POA: Diagnosis not present

## 2015-08-23 DIAGNOSIS — O200 Threatened abortion: Secondary | ICD-10-CM | POA: Diagnosis not present

## 2015-09-02 DIAGNOSIS — O3680X0 Pregnancy with inconclusive fetal viability, not applicable or unspecified: Secondary | ICD-10-CM | POA: Diagnosis not present

## 2015-09-02 DIAGNOSIS — Z3A01 Less than 8 weeks gestation of pregnancy: Secondary | ICD-10-CM | POA: Diagnosis not present

## 2015-09-02 DIAGNOSIS — K5901 Slow transit constipation: Secondary | ICD-10-CM | POA: Diagnosis not present

## 2015-09-16 DIAGNOSIS — R300 Dysuria: Secondary | ICD-10-CM | POA: Diagnosis not present

## 2015-09-16 DIAGNOSIS — Z3A01 Less than 8 weeks gestation of pregnancy: Secondary | ICD-10-CM | POA: Diagnosis not present

## 2015-09-16 DIAGNOSIS — O3680X0 Pregnancy with inconclusive fetal viability, not applicable or unspecified: Secondary | ICD-10-CM | POA: Diagnosis not present

## 2015-10-12 MED FILL — METFORMIN HCL ER 500 MG TAB: 500 | 90 days supply | Qty: 90 | Fill #1

## 2015-10-17 DIAGNOSIS — Z36 Encounter for antenatal screening of mother: Secondary | ICD-10-CM | POA: Diagnosis not present

## 2015-10-17 DIAGNOSIS — O24311 Unspecified pre-existing diabetes mellitus in pregnancy, first trimester: Secondary | ICD-10-CM | POA: Diagnosis not present

## 2015-10-17 DIAGNOSIS — Z3A12 12 weeks gestation of pregnancy: Secondary | ICD-10-CM | POA: Diagnosis not present

## 2015-10-17 MED FILL — FLUTICASONE PROP 50 MCG SPR: 50 | 30 days supply | Qty: 16 | Fill #0

## 2015-10-27 MED FILL — TRUEplus LANCETS 30G MISC: 50 days supply | Qty: 200 | Fill #0

## 2015-10-27 MED FILL — TRUE METRIX GLUCOSE TEST ST: 38 days supply | Qty: 150 | Fill #0

## 2015-11-08 DIAGNOSIS — O24419 Gestational diabetes mellitus in pregnancy, unspecified control: Secondary | ICD-10-CM | POA: Diagnosis not present

## 2015-11-08 DIAGNOSIS — O0991 Supervision of high risk pregnancy, unspecified, first trimester: Secondary | ICD-10-CM | POA: Diagnosis not present

## 2015-11-16 DIAGNOSIS — O0991 Supervision of high risk pregnancy, unspecified, first trimester: Secondary | ICD-10-CM | POA: Diagnosis not present

## 2015-11-16 DIAGNOSIS — O24414 Gestational diabetes mellitus in pregnancy, insulin controlled: Secondary | ICD-10-CM | POA: Diagnosis not present

## 2015-11-16 DIAGNOSIS — Z36 Encounter for antenatal screening of mother: Secondary | ICD-10-CM | POA: Diagnosis not present

## 2015-11-16 MED FILL — ULTICARE PEN NDL 8MM 31G: 31G X 8 MM | 90 days supply | Qty: 100 | Fill #0

## 2015-11-16 MED FILL — HUMULIN N 100 UNITS/ML KWIK: 100 | 90 days supply | Qty: 9 | Fill #0

## 2015-11-18 MED FILL — PROMETHAZINE 25 MG TABLET: 25 | 7 days supply | Qty: 30 | Fill #0

## 2015-12-05 DIAGNOSIS — O24419 Gestational diabetes mellitus in pregnancy, unspecified control: Secondary | ICD-10-CM | POA: Diagnosis not present

## 2015-12-05 DIAGNOSIS — O24414 Gestational diabetes mellitus in pregnancy, insulin controlled: Secondary | ICD-10-CM | POA: Diagnosis not present

## 2015-12-05 DIAGNOSIS — O0991 Supervision of high risk pregnancy, unspecified, first trimester: Secondary | ICD-10-CM | POA: Diagnosis not present

## 2015-12-09 ENCOUNTER — Other Ambulatory Visit: Payer: Self-pay

## 2015-12-09 VITALS — BP 126/86 | HR 90 | Resp 14 | Ht 68.0 in | Wt 242.4 lb

## 2015-12-09 DIAGNOSIS — E119 Type 2 diabetes mellitus without complications: Secondary | ICD-10-CM

## 2015-12-09 NOTE — Patient Outreach (Signed)
[facility]) Care Management   12/09/2015  Veronica Mclaughlin Weitz 04/15/1985 [id number]  Veronica Mclaughlin is an 30 y.o. female. Member seen for initial office visit for Link to Wellness program for self management of Type 2 diabetes and pregnant  Subjective: Member states that she is trying to keep her blood sugars in the goal ranges that her OB/GYN want her to be but she thinks 90 fasting is low and she starts to feel low in the range.  States she is being [facility] regularly and she send her blood sugar logs to them weekly.  States that they have been increasing her insulin as needed.  States she prefers to [facility] her baby at [facility] due to her long standing relationship with her MD there. States that her fasting sugars range 100-120 fasting and 140-200 after meals.  States that the 200 reading was after she ate a funnel cake as she was starting to feel low and she did not have anything else to eat.  Objective:   Review of Systems  Gastrointestinal: Positive for nausea.  All other systems reviewed and are negative.  Reviewed glucometer 7 day average-157 14 day average-146 30 day average-145 Physical Exam  Today's Vitals   12/09/15 1015 12/09/15 1020  BP: 126/86   Pulse: 90   Resp: 14   SpO2: 98%   Weight: 242 lb 6.4 oz (110 kg)   Height: 1.727 m (5\' 8" )   PainSc: 0-No pain 0-No pain   Encounter Medications:   Outpatient Encounter Prescriptions as of 12/09/2015  Medication Sig Note  . diphenhydrAMINE (SOMINEX) 25 MG tablet Take 25 mg by mouth at bedtime as needed. Benadryl 01/27/2015: TAKEN 2 DAYS AGO  . EPINEPHrine 0.3 mg/0.3 mL IJ SOAJ injection Inject 0.3 mLs (0.3 mg total) into the muscle once.   . Insulin NPH, Human,, Isophane, (HUMULIN N) 100 UNIT/ML Kiwkpen Inject 16 Units into the skin at bedtime. 12/09/2015: [facility] Received Sig: Inject 10 Units into the skin nightly.  . metFORMIN  (GLUCOPHAGE-XR) 500 MG 24 hr tablet Take 500 mg by mouth at bedtime. 12/09/2015: [facility] Received Sig: Take 1 tablet (500 mg total) by mouth daily.  . Prenatal Vit-Fe Fumarate-FA (PRENATAL VITAMIN) 27-0.8 MG TABS Take by mouth daily.   . promethazine (PHENERGAN) 25 MG tablet Take 25 mg by mouth every 6 (six) hours as needed for nausea or vomiting.   .     .     .     .     .     .     .      No facility-administered encounter medications on file as of 12/09/2015.     Functional Status:   In your present state of health, do you have any difficulty performing the following activities: 12/09/2015  Hearing? N  Vision? N  Difficulty concentrating or making decisions? N  Walking or climbing stairs? N  Dressing or bathing? N  Doing errands, shopping? N  Some recent data might be hidden    Fall/Depression Screening:    PHQ 2/9 Scores 12/09/2015 12/08/2014  PHQ - 2 Score 0 0    Assessment:  Member seen for initial office visit for Link to Wellness program for self management of Type 2 diabetes and pregnancy.  Member's last hemoglobin A1C was 6.2% before her pregnancy.  Member is [facility] for her pregnancy.  Member is not meeting CBG goals of fasting <90 and 140 or less 1 hour post prandial.  Member has good knowledge of CHO counting but reports higher CHO meals and snacks at times.  Member reports walking 2x week for 30 minutes.  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  Plan:  Plan to eat 45 GM of carbohydrate for lunch and dinner and 30-45 GM at breakfast and 15-20 GM for snacks.  Plan to check blood sugars fasting and 1 hour after meal either fasting  Goals of 90 fasting  or 140 or less after meals Plan to walk 3 times a week for 30 minutes. Plan to carry healthy snacks with you Plan to complete EMMI programs by 02/24/16 Plan to see Link to  Wellness follow up on 03/15/16 at 10 AM  [facility] CM Care Plan Problem One   Flowsheet Row Most Recent Value  Care Plan Problem One  Elevated blood sugars as evidenced by hemoglobin A1C of 6.2% related to dx of type 2 DM and pregnancy  Role Documenting the Problem One  Care Management Coordinator  Care Plan for Problem One  Active  [facility] Long Term Goal (31-90 days)  Member will maintain hemoglobin A1C at or below 6.5% for the next 90 days  [facility] Long Term Goal Start Date  12/09/15  Interventions for Problem One Long Term Goal  Given Link to Wellness diabetes education packet and reviewed program requirements,  Reviewed blood sugar goals her OB/GYN hs given her and the importance of having very tight control of her sugars during pregnacy, Instructed on the effects diabetes has on her fetus, Instructed on importance of regular exercise for glycemic control, Reviwed medications and the peak times on her insulin,  Instructed on the s/s of hypoglycemia and the rule of 15, Encouraged to always carry glucotabs and a snack with her, Instructed to continue to follow up with the Diabetes [facility] Wellness center      [facility] RN, [facility] Care Management Coordinator-Link to Wellness [facility] Care Management [phone]

## 2015-12-09 NOTE — Patient Instructions (Signed)
1. Plan to eat 45 GM of carbohydrate for lunch and dinner and 30-45 GM at breakfast and 15-20 GM for snacks.  2. Plan to check blood sugars fasting and 1 hour after meal either fasting  Goals of 90 fasting  or 140 or less after meals 3. Plan to walk 3 times a week for 30 minutes. 4. Plan to carry healthy snacks with you 5. Plan to complete EMMI programs by 02/24/16 6. Plan to see Link to Wellness follow up on 03/15/16 at 10 AM

## 2015-12-14 DIAGNOSIS — O0992 Supervision of high risk pregnancy, unspecified, second trimester: Secondary | ICD-10-CM | POA: Diagnosis not present

## 2015-12-14 DIAGNOSIS — O24312 Unspecified pre-existing diabetes mellitus in pregnancy, second trimester: Secondary | ICD-10-CM | POA: Diagnosis not present

## 2015-12-14 DIAGNOSIS — Z36 Encounter for antenatal screening of mother: Secondary | ICD-10-CM | POA: Diagnosis not present

## 2015-12-14 DIAGNOSIS — Z3A21 21 weeks gestation of pregnancy: Secondary | ICD-10-CM | POA: Diagnosis not present

## 2015-12-14 DIAGNOSIS — O26899 Other specified pregnancy related conditions, unspecified trimester: Secondary | ICD-10-CM | POA: Diagnosis not present

## 2015-12-14 MED FILL — PROMETHAZINE 25 MG TABLET: 25 | 8 days supply | Qty: 30 | Fill #0

## 2016-01-04 DIAGNOSIS — O24419 Gestational diabetes mellitus in pregnancy, unspecified control: Secondary | ICD-10-CM | POA: Diagnosis not present

## 2016-01-04 DIAGNOSIS — O24414 Gestational diabetes mellitus in pregnancy, insulin controlled: Secondary | ICD-10-CM | POA: Diagnosis not present

## 2016-01-04 DIAGNOSIS — O0991 Supervision of high risk pregnancy, unspecified, first trimester: Secondary | ICD-10-CM | POA: Diagnosis not present

## 2016-01-04 MED FILL — HUMALOG 100 UNITS/ML KWIKPE: 100 | 84 days supply | Qty: 15 | Fill #0

## 2016-01-04 MED FILL — HUMULIN N 100 UNITS/ML KWIK: 100 | 58 days supply | Qty: 15 | Fill #0

## 2016-01-11 DIAGNOSIS — Z23 Encounter for immunization: Secondary | ICD-10-CM | POA: Diagnosis not present

## 2016-01-11 DIAGNOSIS — Z3A25 25 weeks gestation of pregnancy: Secondary | ICD-10-CM | POA: Diagnosis not present

## 2016-01-11 DIAGNOSIS — Z363 Encounter for antenatal screening for malformations: Secondary | ICD-10-CM | POA: Diagnosis not present

## 2016-01-11 DIAGNOSIS — O24312 Unspecified pre-existing diabetes mellitus in pregnancy, second trimester: Secondary | ICD-10-CM | POA: Diagnosis not present

## 2016-01-13 MED FILL — METFORMIN HCL ER 500 MG TAB: 500 | 90 days supply | Qty: 90 | Fill #2

## 2016-01-19 MED FILL — PROMETHAZINE 25 MG TABLET: 25 | 8 days supply | Qty: 30 | Fill #1

## 2016-01-31 DIAGNOSIS — O360130 Maternal care for anti-D [Rh] antibodies, third trimester, not applicable or unspecified: Secondary | ICD-10-CM | POA: Diagnosis not present

## 2016-01-31 DIAGNOSIS — O0993 Supervision of high risk pregnancy, unspecified, third trimester: Secondary | ICD-10-CM | POA: Diagnosis not present

## 2016-01-31 DIAGNOSIS — O24113 Pre-existing diabetes mellitus, type 2, in pregnancy, third trimester: Secondary | ICD-10-CM | POA: Diagnosis not present

## 2016-01-31 DIAGNOSIS — Z3A28 28 weeks gestation of pregnancy: Secondary | ICD-10-CM | POA: Diagnosis not present

## 2016-01-31 DIAGNOSIS — O99213 Obesity complicating pregnancy, third trimester: Secondary | ICD-10-CM | POA: Diagnosis not present

## 2016-01-31 DIAGNOSIS — Z369 Encounter for antenatal screening, unspecified: Secondary | ICD-10-CM | POA: Diagnosis not present

## 2016-02-06 DIAGNOSIS — O24414 Gestational diabetes mellitus in pregnancy, insulin controlled: Secondary | ICD-10-CM | POA: Diagnosis not present

## 2016-02-06 DIAGNOSIS — O0993 Supervision of high risk pregnancy, unspecified, third trimester: Secondary | ICD-10-CM | POA: Diagnosis not present

## 2016-02-13 DIAGNOSIS — Z23 Encounter for immunization: Secondary | ICD-10-CM | POA: Diagnosis not present

## 2016-02-15 MED FILL — PROMETHAZINE 25 MG TABLET: 25 | 8 days supply | Qty: 30 | Fill #2

## 2016-02-21 MED FILL — HUMALOG 100 UNITS/ML KWIKPE: 100 | 42 days supply | Qty: 15 | Fill #0

## 2016-02-21 MED FILL — HUMULIN N 100 UNITS/ML KWIK: 100 | 36 days supply | Qty: 15 | Fill #0

## 2016-02-27 DIAGNOSIS — Z3A31 31 weeks gestation of pregnancy: Secondary | ICD-10-CM | POA: Diagnosis not present

## 2016-02-27 DIAGNOSIS — O24313 Unspecified pre-existing diabetes mellitus in pregnancy, third trimester: Secondary | ICD-10-CM | POA: Diagnosis not present

## 2016-02-27 DIAGNOSIS — O24414 Gestational diabetes mellitus in pregnancy, insulin controlled: Secondary | ICD-10-CM | POA: Diagnosis not present

## 2016-02-27 DIAGNOSIS — Z362 Encounter for other antenatal screening follow-up: Secondary | ICD-10-CM | POA: Diagnosis not present

## 2016-02-27 DIAGNOSIS — E119 Type 2 diabetes mellitus without complications: Secondary | ICD-10-CM | POA: Diagnosis not present

## 2016-02-27 DIAGNOSIS — O0993 Supervision of high risk pregnancy, unspecified, third trimester: Secondary | ICD-10-CM | POA: Diagnosis not present

## 2016-03-09 DIAGNOSIS — Z209 Contact with and (suspected) exposure to unspecified communicable disease: Secondary | ICD-10-CM | POA: Diagnosis not present

## 2016-03-12 DIAGNOSIS — E119 Type 2 diabetes mellitus without complications: Secondary | ICD-10-CM | POA: Diagnosis not present

## 2016-03-12 DIAGNOSIS — O0993 Supervision of high risk pregnancy, unspecified, third trimester: Secondary | ICD-10-CM | POA: Diagnosis not present

## 2016-03-12 DIAGNOSIS — Z3A34 34 weeks gestation of pregnancy: Secondary | ICD-10-CM | POA: Diagnosis not present

## 2016-03-15 ENCOUNTER — Ambulatory Visit: Payer: Self-pay

## 2016-03-15 DIAGNOSIS — Z3482 Encounter for supervision of other normal pregnancy, second trimester: Secondary | ICD-10-CM | POA: Diagnosis not present

## 2016-03-15 DIAGNOSIS — Z3483 Encounter for supervision of other normal pregnancy, third trimester: Secondary | ICD-10-CM | POA: Diagnosis not present

## 2016-03-15 MED FILL — ULTCARE INS SYR 1 ML 31GX5/: 31G X 5/16" | 90 days supply | Qty: 100 | Fill #0

## 2016-03-15 MED FILL — HumuLIN N 100 UNIT/ML SUSP: 100 | 34 days supply | Qty: 20 | Fill #0

## 2016-03-16 DIAGNOSIS — Z3A34 34 weeks gestation of pregnancy: Secondary | ICD-10-CM | POA: Diagnosis not present

## 2016-03-16 DIAGNOSIS — O24313 Unspecified pre-existing diabetes mellitus in pregnancy, third trimester: Secondary | ICD-10-CM | POA: Diagnosis not present

## 2016-03-21 DIAGNOSIS — Z3A35 35 weeks gestation of pregnancy: Secondary | ICD-10-CM | POA: Diagnosis not present

## 2016-03-21 DIAGNOSIS — O24313 Unspecified pre-existing diabetes mellitus in pregnancy, third trimester: Secondary | ICD-10-CM | POA: Diagnosis not present

## 2016-03-22 DIAGNOSIS — Z3A35 35 weeks gestation of pregnancy: Secondary | ICD-10-CM | POA: Diagnosis not present

## 2016-03-22 DIAGNOSIS — O133 Gestational [pregnancy-induced] hypertension without significant proteinuria, third trimester: Secondary | ICD-10-CM | POA: Diagnosis not present

## 2016-03-22 DIAGNOSIS — Z369 Encounter for antenatal screening, unspecified: Secondary | ICD-10-CM | POA: Diagnosis not present

## 2016-03-22 DIAGNOSIS — O10013 Pre-existing essential hypertension complicating pregnancy, third trimester: Secondary | ICD-10-CM | POA: Diagnosis not present

## 2016-03-26 DIAGNOSIS — O1403 Mild to moderate pre-eclampsia, third trimester: Secondary | ICD-10-CM | POA: Diagnosis not present

## 2016-03-26 DIAGNOSIS — O10013 Pre-existing essential hypertension complicating pregnancy, third trimester: Secondary | ICD-10-CM | POA: Diagnosis not present

## 2016-03-26 DIAGNOSIS — O09899 Supervision of other high risk pregnancies, unspecified trimester: Secondary | ICD-10-CM | POA: Diagnosis not present

## 2016-03-26 DIAGNOSIS — Z3A36 36 weeks gestation of pregnancy: Secondary | ICD-10-CM | POA: Diagnosis not present

## 2016-03-26 DIAGNOSIS — O24113 Pre-existing diabetes mellitus, type 2, in pregnancy, third trimester: Secondary | ICD-10-CM | POA: Diagnosis not present

## 2016-03-26 DIAGNOSIS — O0993 Supervision of high risk pregnancy, unspecified, third trimester: Secondary | ICD-10-CM | POA: Diagnosis not present

## 2016-03-26 DIAGNOSIS — E119 Type 2 diabetes mellitus without complications: Secondary | ICD-10-CM | POA: Diagnosis not present

## 2016-03-28 MED FILL — LABETALOL HCL 200 MG TABLET: 200 | 30 days supply | Qty: 90 | Fill #0

## 2016-03-30 DIAGNOSIS — O0993 Supervision of high risk pregnancy, unspecified, third trimester: Secondary | ICD-10-CM | POA: Diagnosis not present

## 2016-03-30 DIAGNOSIS — O10013 Pre-existing essential hypertension complicating pregnancy, third trimester: Secondary | ICD-10-CM | POA: Diagnosis not present

## 2016-03-30 DIAGNOSIS — Z3A36 36 weeks gestation of pregnancy: Secondary | ICD-10-CM | POA: Diagnosis not present

## 2016-03-30 DIAGNOSIS — Z369 Encounter for antenatal screening, unspecified: Secondary | ICD-10-CM | POA: Diagnosis not present

## 2016-03-30 DIAGNOSIS — O24313 Unspecified pre-existing diabetes mellitus in pregnancy, third trimester: Secondary | ICD-10-CM | POA: Diagnosis not present

## 2016-04-02 DIAGNOSIS — O0993 Supervision of high risk pregnancy, unspecified, third trimester: Secondary | ICD-10-CM | POA: Diagnosis not present

## 2016-04-03 DIAGNOSIS — Z3A37 37 weeks gestation of pregnancy: Secondary | ICD-10-CM | POA: Diagnosis not present

## 2016-04-03 DIAGNOSIS — O09893 Supervision of other high risk pregnancies, third trimester: Secondary | ICD-10-CM | POA: Diagnosis not present

## 2016-04-03 DIAGNOSIS — O24313 Unspecified pre-existing diabetes mellitus in pregnancy, third trimester: Secondary | ICD-10-CM | POA: Diagnosis not present

## 2016-04-03 DIAGNOSIS — O0993 Supervision of high risk pregnancy, unspecified, third trimester: Secondary | ICD-10-CM | POA: Diagnosis not present

## 2016-04-03 DIAGNOSIS — O10013 Pre-existing essential hypertension complicating pregnancy, third trimester: Secondary | ICD-10-CM | POA: Diagnosis not present

## 2016-04-05 DIAGNOSIS — Z3A00 Weeks of gestation of pregnancy not specified: Secondary | ICD-10-CM | POA: Diagnosis not present

## 2016-04-05 DIAGNOSIS — O99214 Obesity complicating childbirth: Secondary | ICD-10-CM | POA: Diagnosis not present

## 2016-04-05 DIAGNOSIS — O2412 Pre-existing diabetes mellitus, type 2, in childbirth: Secondary | ICD-10-CM | POA: Diagnosis not present

## 2016-04-05 DIAGNOSIS — O24313 Unspecified pre-existing diabetes mellitus in pregnancy, third trimester: Secondary | ICD-10-CM | POA: Diagnosis not present

## 2016-04-05 DIAGNOSIS — Z3A39 39 weeks gestation of pregnancy: Secondary | ICD-10-CM | POA: Diagnosis not present

## 2016-04-05 DIAGNOSIS — Z6836 Body mass index (BMI) 36.0-36.9, adult: Secondary | ICD-10-CM | POA: Diagnosis not present

## 2016-04-05 DIAGNOSIS — E1140 Type 2 diabetes mellitus with diabetic neuropathy, unspecified: Secondary | ICD-10-CM | POA: Diagnosis not present

## 2016-04-05 DIAGNOSIS — Z3A37 37 weeks gestation of pregnancy: Secondary | ICD-10-CM | POA: Diagnosis not present

## 2016-04-05 DIAGNOSIS — O10013 Pre-existing essential hypertension complicating pregnancy, third trimester: Secondary | ICD-10-CM | POA: Diagnosis not present

## 2016-04-05 DIAGNOSIS — O114 Pre-existing hypertension with pre-eclampsia, complicating childbirth: Secondary | ICD-10-CM | POA: Diagnosis not present

## 2016-04-05 DIAGNOSIS — Z794 Long term (current) use of insulin: Secondary | ICD-10-CM | POA: Diagnosis not present

## 2016-04-10 MED FILL — METFORMIN HCL ER 500 MG TAB: 500 | 90 days supply | Qty: 90 | Fill #3

## 2016-04-10 MED FILL — IBUPROFEN 600 MG TABLET: 600 | 7 days supply | Qty: 30 | Fill #0

## 2016-04-10 MED FILL — OXYCODONE W/APAP 5/325 TAB: 5-325 | 5 days supply | Qty: 30 | Fill #0

## 2016-04-12 DIAGNOSIS — R0600 Dyspnea, unspecified: Secondary | ICD-10-CM | POA: Diagnosis not present

## 2016-04-12 DIAGNOSIS — R0689 Other abnormalities of breathing: Secondary | ICD-10-CM | POA: Diagnosis not present

## 2016-04-12 MED FILL — AZITHROMYCIN 250 MG TABLET: 250 | 5 days supply | Qty: 6 | Fill #0

## 2016-05-11 DIAGNOSIS — Z130 Encounter for screening for diseases of the blood and blood-forming organs and certain disorders involving the immune mechanism: Secondary | ICD-10-CM | POA: Diagnosis not present

## 2016-05-11 MED FILL — METOCLOPRAMIDE 10 MG TABLET: 10 | 14 days supply | Qty: 42 | Fill #0

## 2016-05-11 MED FILL — ESCITALOPRAM 10 MG TABLET: 10 | 90 days supply | Qty: 90 | Fill #0

## 2016-05-11 MED FILL — NORETHINDRONE 0.35 MG TAB: 0.35 | 84 days supply | Qty: 84 | Fill #0

## 2016-05-21 ENCOUNTER — Ambulatory Visit: Payer: Self-pay

## 2016-05-23 DIAGNOSIS — J40 Bronchitis, not specified as acute or chronic: Secondary | ICD-10-CM | POA: Diagnosis not present

## 2016-05-23 DIAGNOSIS — R072 Precordial pain: Secondary | ICD-10-CM | POA: Diagnosis not present

## 2016-05-23 DIAGNOSIS — R079 Chest pain, unspecified: Secondary | ICD-10-CM | POA: Diagnosis not present

## 2016-05-24 MED FILL — PROAIR HFA 90 MCG INHALER: 108 (90 BAS | 25 days supply | Qty: 9 | Fill #0

## 2016-05-30 ENCOUNTER — Encounter: Payer: Self-pay | Admitting: Medical

## 2016-05-30 ENCOUNTER — Ambulatory Visit (INDEPENDENT_AMBULATORY_CARE_PROVIDER_SITE_OTHER): Payer: 59 | Admitting: Medical

## 2016-05-30 VITALS — BP 120/81 | HR 74 | Temp 98.0°F | Resp 16 | Ht 67.0 in | Wt 238.0 lb

## 2016-05-30 DIAGNOSIS — H6981 Other specified disorders of Eustachian tube, right ear: Secondary | ICD-10-CM | POA: Diagnosis not present

## 2016-05-30 DIAGNOSIS — E119 Type 2 diabetes mellitus without complications: Secondary | ICD-10-CM

## 2016-05-30 LAB — HEMOGLOBIN A1C: HEMOGLOBIN A1C: 5.1 % (ref 4.6–6.5)

## 2016-05-30 MED ORDER — FLUTICASONE PROPIONATE 50 MCG/ACT NA SUSP: 2.0000 | Freq: Every day | NASAL | 1 refills | Status: DC

## 2016-05-30 MED ORDER — CEPHALEXIN 500 MG PO CAPS: 500.0000 mg | ORAL_CAPSULE | Freq: Two times a day (BID) | ORAL | 0 refills | Status: DC

## 2016-05-30 MED ORDER — METFORMIN HCL ER 500 MG PO TB24: ORAL_TABLET | ORAL | 3 refills | Status: DC

## 2016-05-30 MED FILL — FLUTICASONE PROP 50 MCG SPR: 50 | 30 days supply | Qty: 16 | Fill #0

## 2016-05-30 MED FILL — CEPHALEXIN 500 MG CAPSULE: 500 | 10 days supply | Qty: 20 | Fill #0

## 2016-05-30 NOTE — Progress Notes (Signed)
Pre visit review using our clinic review tool, if applicable. No additional management support is needed unless otherwise documented below in the visit note/SLS  

## 2016-05-30 NOTE — Progress Notes (Signed)
Subjective:    Patient ID: Veronica Mclaughlin, female    DOB: 04-12-1985, 31 y.o.   MRN: [id number]  HPI  Pt in for some ear pain off and for 10 months. But worse last 2 days. Pt states last wed was at emergency dept for bronchitis. Pt was given albuterol at [facility]. CXR done but not pneuomonia.   Pt states no history of ear infections,.  Pt also is diabetic. Her sugars have been 130-150. Last a1-c was 6.2 in July. Pt is on metformin xr 500 q day. Pt states ob told her to continue metformin after she delivered her baby. Was on insulin when pregnant. Last month of pregnancy sugars were real high.   Pt currently on lexapro, reglan, prenatal, metformin and labetol. Pt told by OB/GYN told her these are safe during pregnancy (told to continue after delivery)  Per pt she states her sugars were so high at end of pregnancy that a1c could be off.    Review of Systems  Constitutional: Negative for chills, fatigue and fever.  HENT: Positive for ear pain, postnasal drip and sneezing. Negative for dental problem, rhinorrhea, sinus pain, sinus pressure, tinnitus and trouble swallowing.        Pt thinks she is not having nasal congestion. Rare sneeze.  Respiratory: Negative for cough and chest tightness.   Cardiovascular: Negative for chest pain and palpitations.  Gastrointestinal: Negative for abdominal pain.  Musculoskeletal: Negative for back pain.  Skin: Negative for rash.  Neurological: Negative for dizziness and headaches.  Hematological: Negative for adenopathy. Does not bruise/bleed easily.  Psychiatric/Behavioral: Negative for behavioral problems and confusion.    Past Medical History:  Diagnosis Date  . Diabetes mellitus type II, controlled (HCC)   . Diabetic neuropathy (HCC)   . Gallstones   . History of colonic polyps   . PCOS (polycystic ovarian syndrome)      Social History   Social History  . Marital status: Married    Spouse name: N/A  . Number of children: N/A    . Years of education: N/A   Occupational History  . Not on file.   Social History Main Topics  . Smoking status: Never Smoker  . Smokeless tobacco: Never Used  . Alcohol use 0.0 oz/week     Comment: 1 glass of wine a month  . Drug use: No  . Sexual activity: Yes    Partners: Male   Other Topics Concern  . Not on file   Social History Narrative   Lives with husband in a one story home.  No children.     Works as an Museum/gallery exhibitions officer.     Education: associates degree.    Past Surgical History:  Procedure Laterality Date  . CHOLECYSTECTOMY  2014  . COLONOSCOPY  2005    Family History  Problem Relation Age of Onset  . Diabetes Mother     Living  . Hypertension Mother   . Uterine cancer Mother     Pre-cancerous  . Thyroid disease Mother   . Diabetes type II Father     Living  . Hypertension Father   . Gout Father   . Neuropathy Father   . Breast cancer Maternal Grandmother   . Heart attack Paternal Grandfather   . Colon cancer Paternal Grandfather   . Colon cancer Maternal Grandfather   . Heart defect Brother   . Prostate cancer Maternal Uncle   . Alcoholism Other     Paternal Side  . Diabetes Maternal  Aunt   . Diabetes Maternal Uncle     Allergies  Allergen Reactions  . Plan B [Levonorgestrel] Anaphylaxis  . Progesterone Anaphylaxis  . Mobic [Meloxicam] Rash  . Pineapple Palpitations and Rash    Current Outpatient Prescriptions on File Prior to Visit  Medication Sig Dispense Refill  . EPINEPHrine 0.3 mg/0.3 mL IJ SOAJ injection Inject 0.3 mLs (0.3 mg total) into the muscle once. 1 Device 0  . Prenatal Vit-Fe Fumarate-FA (PRENATAL VITAMIN) 27-0.8 MG TABS Take by mouth daily.    Marland Kitchen. dicyclomine (BENTYL) 10 MG capsule Take 10 mg by mouth 4 (four) times daily -  before meals and at bedtime. As Needed    . diphenhydrAMINE (SOMINEX) 25 MG tablet Take 25 mg by mouth at bedtime as needed. Benadryl    . gabapentin (NEURONTIN) 100 MG capsule Take 2 capsules (200 mg total) by  mouth at bedtime. (Patient not taking: Reported on 12/09/2015) 60 capsule 6  . Insulin NPH, Human,, Isophane, (HUMULIN N) 100 UNIT/ML Kiwkpen Inject 16 Units into the skin at bedtime.    . lidocaine (XYLOCAINE) 5 % ointment Apply 1 application topically at bedtime as needed. (Patient not taking: Reported on 12/09/2015) 35.44 g 3  . ondansetron (ZOFRAN) 8 MG tablet Take 1 tablet (8 mg total) by mouth every 8 (eight) hours as needed for nausea or vomiting. (Patient not taking: Reported on 12/09/2015) 20 tablet 0  . promethazine (PHENERGAN) 25 MG tablet Take 25 mg by mouth every 6 (six) hours as needed for nausea or vomiting.    Marland Kitchen. tiZANidine (ZANAFLEX) 4 MG tablet Take 1 tablet (4 mg total) by mouth every 6 (six) hours as needed for muscle spasms. (Patient not taking: Reported on 12/09/2015) 60 tablet 0   No current facility-administered medications on file prior to visit.     BP 120/81 (BP Location: Left Arm, Patient Position: Sitting, Cuff Size: Large)   Pulse 74   Temp 98 F (36.7 C) (Oral)   Resp 16   Ht 5\' 7"  (1.702 m)   Wt 238 lb (108 kg)   SpO2 100%   Breastfeeding? Yes   BMI 37.28 kg/m       Objective:   Physical Exam  General  Mental Status - Alert. General Appearance - Well groomed. Not in acute distress.  Skin Rashes- No Rashes.  HEENT Head- Normal. Ear Auditory Canal - Left- Normal. Right - Normal.Tympanic Membrane- Left- faint dull. Right- faint dull. Eye Sclera/Conjunctiva- Left- Normal. Right- Normal. Nose & Sinuses Nasal Mucosa- Left-  Boggy and Congested. Right-  Boggy and  Congested.Bilateral no  maxillary and no  frontal sinus pressure. Mouth & Throat Lips: Upper Lip- Normal: no dryness, cracking, pallor, cyanosis, or vesicular eruption. Lower Lip-Normal: no dryness, cracking, pallor, cyanosis or vesicular eruption. Buccal Mucosa- Bilateral- No Aphthous ulcers. Oropharynx- No Discharge or Erythema. Tonsils: Characteristics- Bilateral- No Erythema or Congestion.  Size/Enlargement- Bilateral- No enlargement. Discharge- bilateral-None.  Neck Neck- Supple. No Masses.   Chest and Lung Exam Auscultation: Breath Sounds:-Clear even and unlabored.  Cardiovascular Auscultation:Rythm- Regular, rate and rhythm. Murmurs & Other Heart Sounds:Ausculatation of the heart reveal- No Murmurs.  Lymphatic Head & Neck General Head & Neck Lymphatics: Bilateral: Description- No Localized lymphadenopathy.       Assessment & Plan:  You appear to have ear pressure related to possible allergies. Use flonase spray. If ear pain/pressure worsens over weekend then start keflex.  Foe diabetes and recent mild high sugars rx mefformin to use 2 tab at  night. Please get a1c today.  Follow up 7 days or as needed  After lab review will decide if needs 3 or 6 month visit for diabetes.

## 2016-05-30 NOTE — Patient Instructions (Signed)
You appear to have ear pressure related to possible allergies. Use flonase spray. If ear pain/pressure worsens over weekend then start keflex.  Foe diabetes and recent mild high sugars rx mefformin to use 2 tab at night. Please get a1c today.  Follow up 7 days or as needed

## 2016-06-26 MED FILL — LABETALOL HCL 200 MG TABLET: 200 | 30 days supply | Qty: 90 | Fill #0

## 2016-06-29 DIAGNOSIS — Z30011 Encounter for initial prescription of contraceptive pills: Secondary | ICD-10-CM | POA: Diagnosis not present

## 2016-06-29 DIAGNOSIS — F3341 Major depressive disorder, recurrent, in partial remission: Secondary | ICD-10-CM | POA: Diagnosis not present

## 2016-06-29 DIAGNOSIS — I10 Essential (primary) hypertension: Secondary | ICD-10-CM | POA: Diagnosis not present

## 2016-06-29 DIAGNOSIS — E119 Type 2 diabetes mellitus without complications: Secondary | ICD-10-CM | POA: Diagnosis not present

## 2016-07-02 MED FILL — METFORMIN HCL ER 500 MG TAB: 500 | 30 days supply | Qty: 60 | Fill #0

## 2016-07-06 MED FILL — LO LOESTRIN FE 1-10 TABLET: 1 MG-10 MCG | 28 days supply | Qty: 28 | Fill #0

## 2016-07-20 DIAGNOSIS — Z3483 Encounter for supervision of other normal pregnancy, third trimester: Secondary | ICD-10-CM | POA: Diagnosis not present

## 2016-07-20 DIAGNOSIS — Z3482 Encounter for supervision of other normal pregnancy, second trimester: Secondary | ICD-10-CM | POA: Diagnosis not present

## 2016-08-02 MED FILL — LO LOESTRIN FE 1-10 TABLET: 1 MG-10 MCG | 28 days supply | Qty: 28 | Fill #1

## 2016-08-07 MED FILL — ESCITALOPRAM 10 MG TABLET: 10 | 90 days supply | Qty: 90 | Fill #1

## 2016-08-21 MED FILL — METFORMIN HCL ER 500 MG TAB: 500 | 30 days supply | Qty: 60 | Fill #1

## 2016-08-29 DIAGNOSIS — E119 Type 2 diabetes mellitus without complications: Secondary | ICD-10-CM | POA: Diagnosis not present

## 2016-09-11 MED FILL — LABETALOL HCL 200 MG TABLET: 200 | 90 days supply | Qty: 90 | Fill #0

## 2016-09-11 MED FILL — PROMETHAZINE 25 MG TABLET: 25 | 8 days supply | Qty: 30 | Fill #0

## 2016-10-11 ENCOUNTER — Telehealth: Payer: Self-pay

## 2016-10-11 IMAGING — CT CT ABD-PELV W/ CM
2 of 4 series · 16 of 46 positions shown, 18 images · IV contrast (omnipaque)
Comparison: Pelvic ultrasound 04/10/2014

CLINICAL DATA: Right-sided abdominal pain for 1 day.  Nausea.

EXAM:
CT ABDOMEN AND PELVIS WITH CONTRAST
TECHNIQUE: Multidetector CT imaging of the abdomen and pelvis was performed
using the standard protocol following bolus administration of
intravenous contrast.
CONTRAST:  25mL OMNIPAQUE IOHEXOL 300 MG/ML SOLN, 100mL OMNIPAQUE
IOHEXOL 300 MG/ML SOLN

[Series 2: abd/pelvis 5.0 b31f · axial · 0.85mm/px · z∈[-612,-172]mm · 13 of 98 slices shown, 15 images]
[im 5/98  soft-tissue]
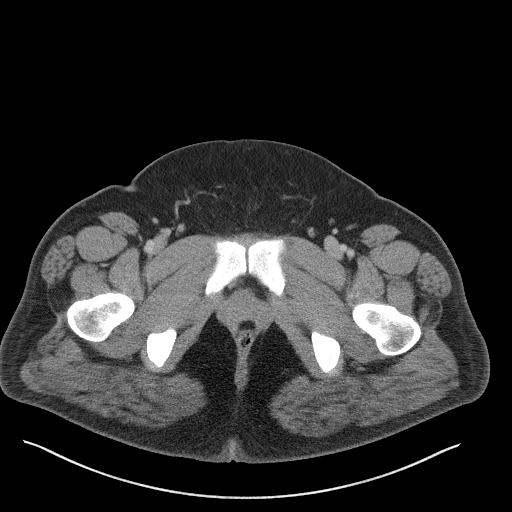
[im 5/98  bone]
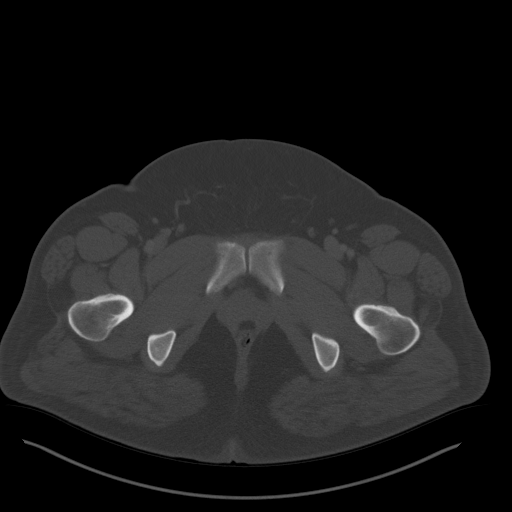
[im 13/98  soft-tissue]
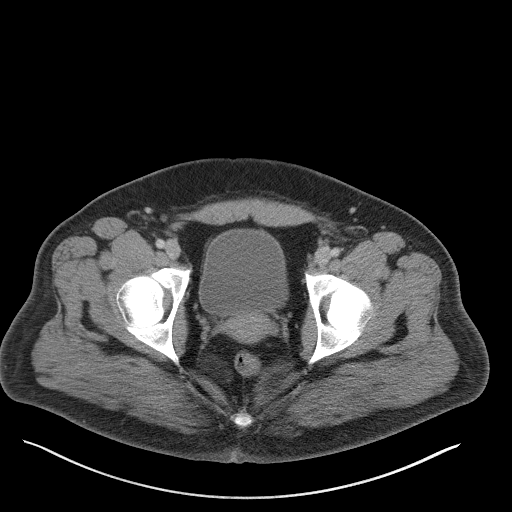
[im 22/98  soft-tissue]
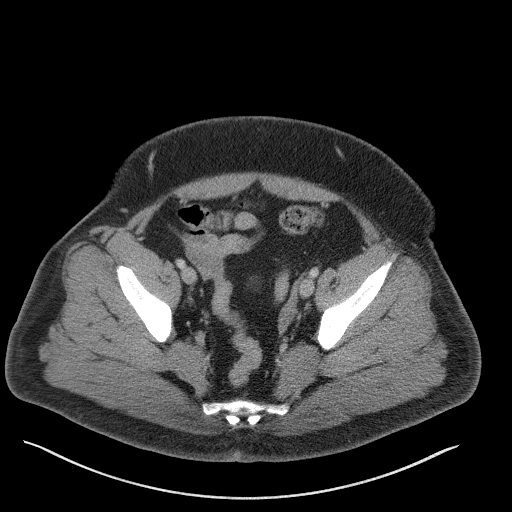
[im 26/98  soft-tissue]
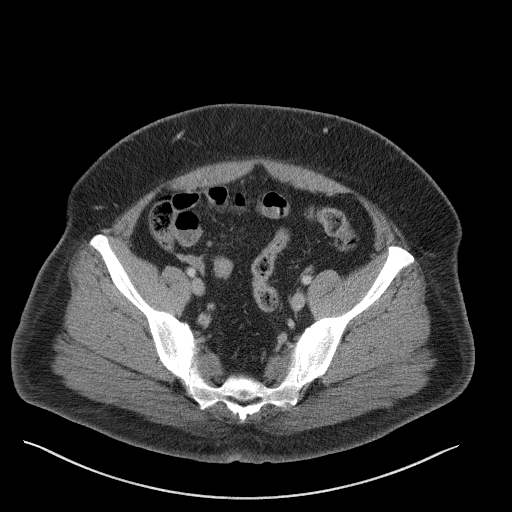
[im 34/98  soft-tissue]
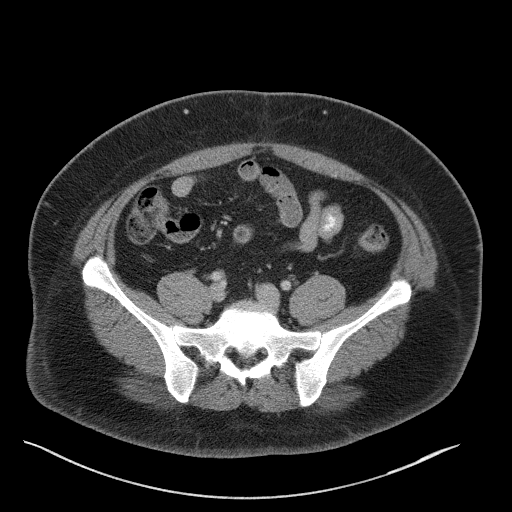
[im 43/98  soft-tissue]
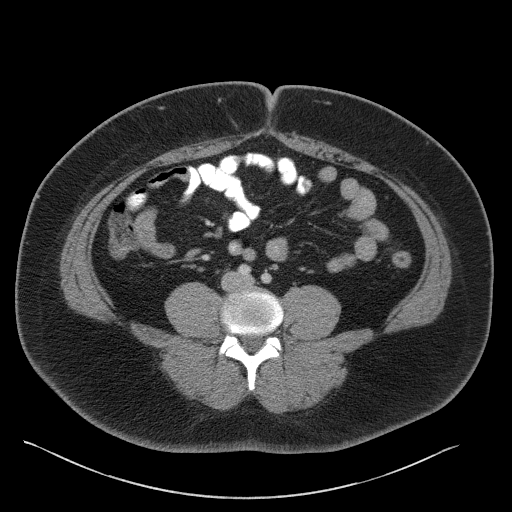
[im 51/98  soft-tissue]
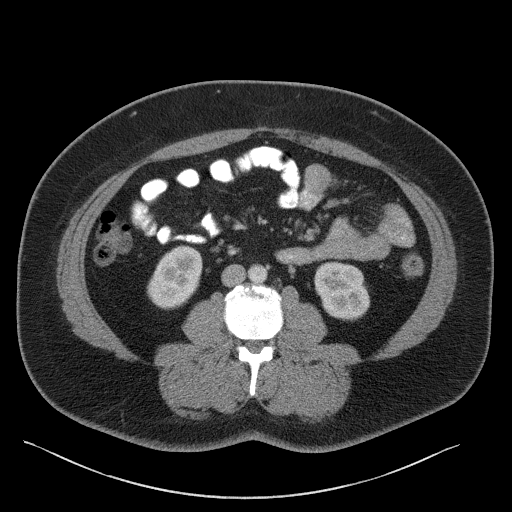
[im 55/98  soft-tissue]
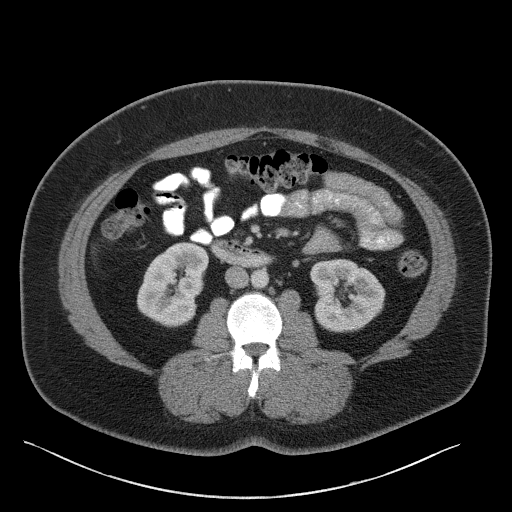
[im 64/98  soft-tissue]
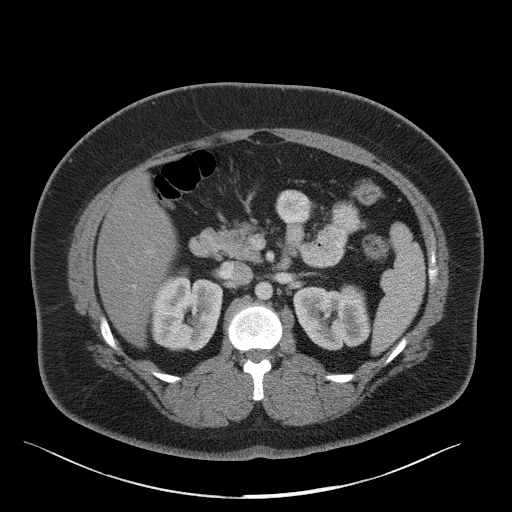
[im 64/98  bone]
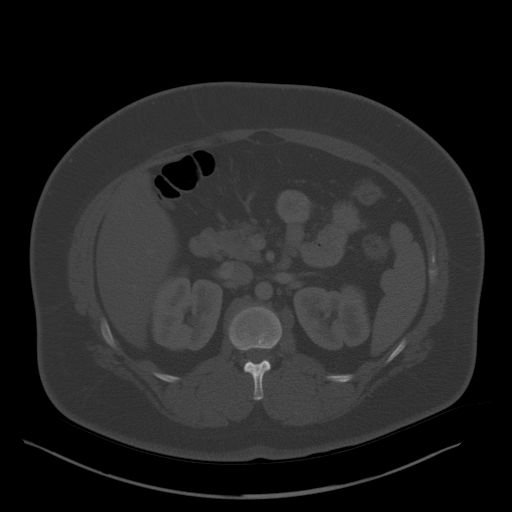
[im 72/98  soft-tissue]
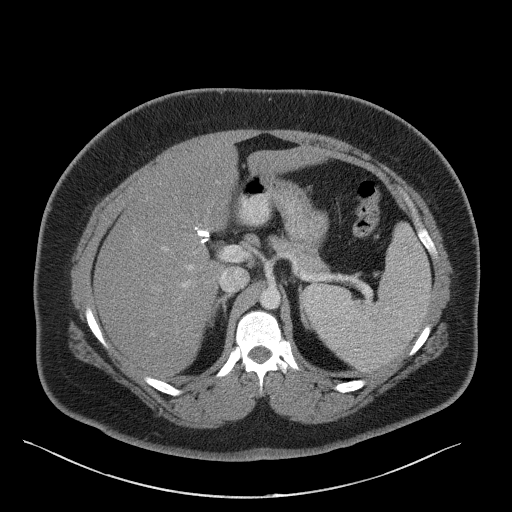
[im 76/98  soft-tissue]
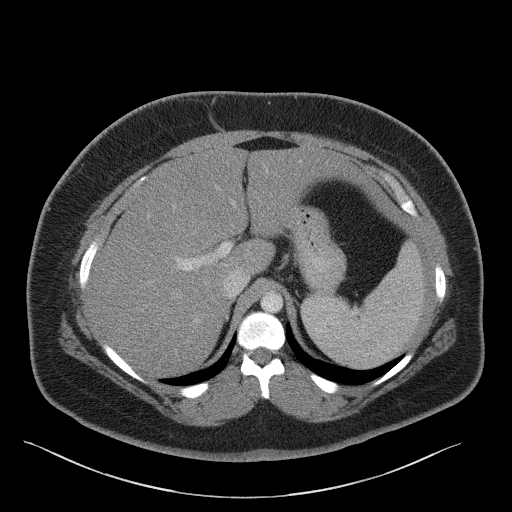
[im 85/98  soft-tissue]
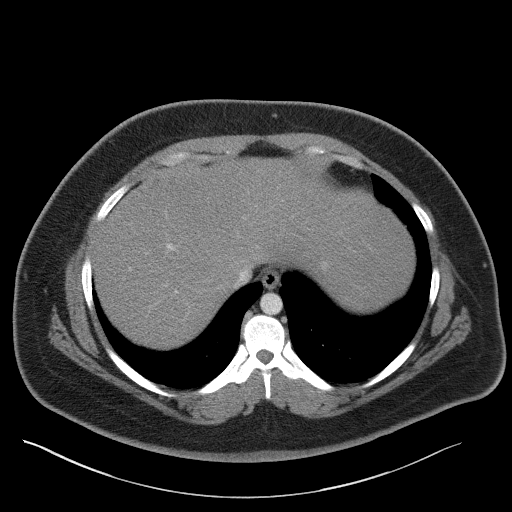
[im 93/98  soft-tissue]
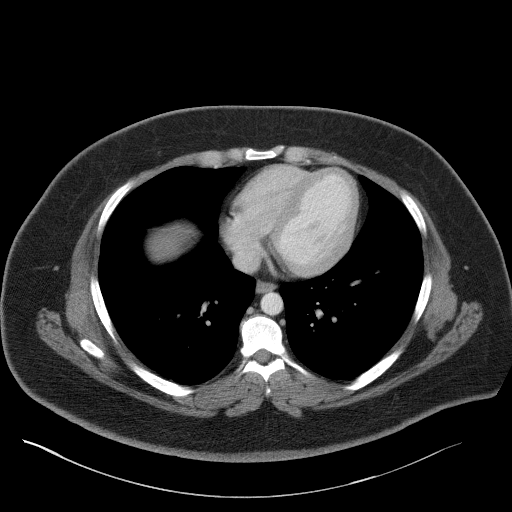

[Series 5: abd/pelvis 3.0 coronal · coronal · 0.81mm/px · 3 of 105 slices shown]
[im 35/105  soft-tissue]
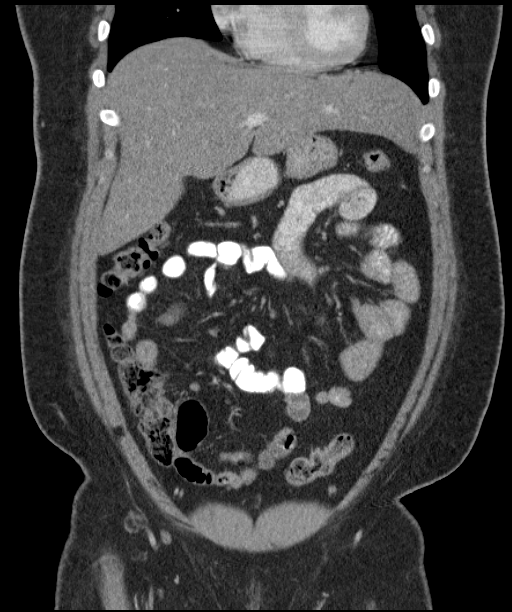
[im 47/105  soft-tissue]
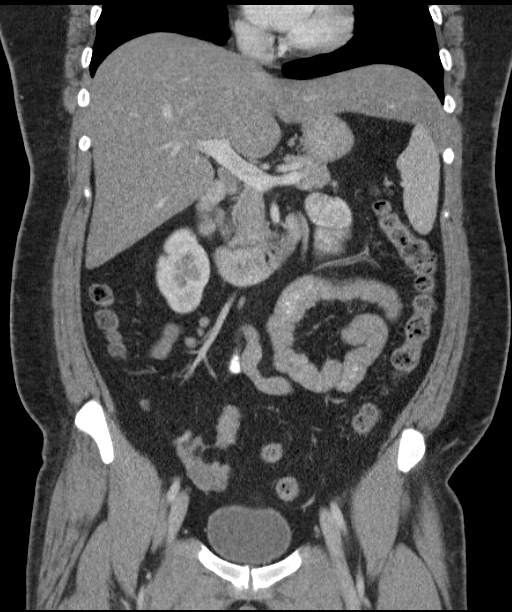
[im 58/105  soft-tissue]
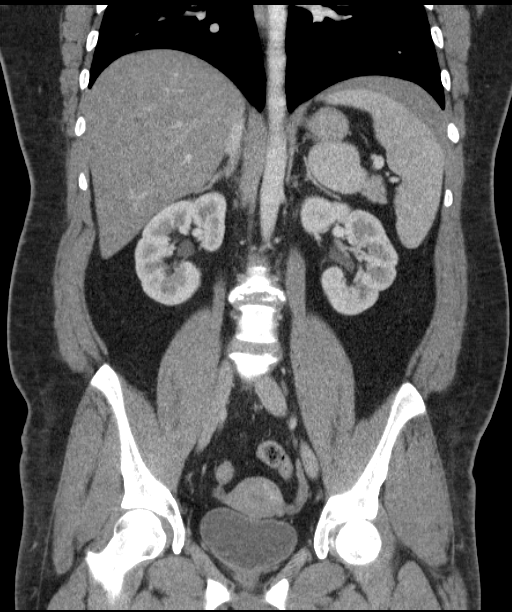

[16 of 46 positions shown; findings below may reference images not displayed]

FINDINGS: The included lung bases are clear.

Elongated left hepatic lobe, diffuse hepatic steatosis. No focal
hepatic lesion. Postcholecystectomy clips in the gallbladder fossa.
No biliary dilatation. Spleen at the upper limits of normal in size
measuring 13 cm. Pancreas, adrenal glands, and kidneys are normal.

The stomach is physiologically distended. There are no dilated or
thickened bowel loops. The appendix is normal. Small volume of
colonic stool without colonic wall thickening. No free air, free
fluid, or intra-abdominal fluid collection.

Abdominal aorta is normal in caliber. No retroperitoneal adenopathy.

Within the pelvis the urinary bladder is physiologically distended.
Uterus is normal for age. The ovaries appear symmetric in size
without adnexal mass. No pelvic free fluid. No pelvic adenopathy.

There are no acute or suspicious osseous abnormalities. Mild
sclerosis about the sacroiliac joints. Limbic vertebra noted at L3.
IMPRESSION: 1. No acute abnormality in the abdomen/pelvis.
2. Hepatic steatosis.  Borderline hepatosplenomegaly.

## 2016-10-11 NOTE — Telephone Encounter (Signed)
Pre visit call completed 

## 2016-10-12 ENCOUNTER — Encounter: Payer: Self-pay | Admitting: Family Medicine

## 2016-10-12 ENCOUNTER — Ambulatory Visit (INDEPENDENT_AMBULATORY_CARE_PROVIDER_SITE_OTHER): Payer: 59 | Admitting: Family Medicine

## 2016-10-12 VITALS — BP 110/82 | HR 86 | Temp 98.7°F | Ht 67.0 in | Wt 247.4 lb

## 2016-10-12 DIAGNOSIS — E119 Type 2 diabetes mellitus without complications: Secondary | ICD-10-CM

## 2016-10-12 LAB — CBC
HCT: 45.1 % (ref 36.0–46.0)
HEMOGLOBIN: 15.6 g/dL — AB (ref 12.0–15.0)
MCHC: 34.6 g/dL (ref 30.0–36.0)
MCV: 93.2 fl (ref 78.0–100.0)
PLATELETS: 207 10*3/uL (ref 150.0–400.0)
RBC: 4.84 Mil/uL (ref 3.87–5.11)
RDW: 13 % (ref 11.5–15.5)
WBC: 5.6 10*3/uL (ref 4.0–10.5)

## 2016-10-12 LAB — COMPREHENSIVE METABOLIC PANEL
ALK PHOS: 50 U/L (ref 39–117)
ALT: 80 U/L — AB (ref 0–35)
AST: 70 U/L — ABNORMAL HIGH (ref 0–37)
Albumin: 4.2 g/dL (ref 3.5–5.2)
BILIRUBIN TOTAL: 0.5 mg/dL (ref 0.2–1.2)
BUN: 12 mg/dL (ref 6–23)
CALCIUM: 9.4 mg/dL (ref 8.4–10.5)
CO2: 25 mEq/L (ref 19–32)
Chloride: 102 mEq/L (ref 96–112)
Creatinine, Ser: 0.7 mg/dL (ref 0.40–1.20)
GFR: 105.36 mL/min (ref >60.00)
GLUCOSE: 231 mg/dL — AB (ref 70–99)
Potassium: 4 mEq/L (ref 3.5–5.1)
Sodium: 136 mEq/L (ref 135–145)
TOTAL PROTEIN: 6.6 g/dL (ref 6.0–8.3)

## 2016-10-12 LAB — MICROALBUMIN / CREATININE URINE RATIO
Creatinine,U: 186.6 mg/dL
MICROALB/CREAT RATIO: 3.4 mg/g (ref 0.0–30.0)
Microalb, Ur: 6.4 mg/dL — ABNORMAL HIGH (ref 0.0–1.9)

## 2016-10-12 LAB — TSH: TSH: 1.52 u[IU]/mL (ref 0.35–4.50)

## 2016-10-12 LAB — HEMOGLOBIN A1C: Hgb A1c MFr Bld: 7.9 % — ABNORMAL HIGH (ref 4.6–6.5)

## 2016-10-12 MED ORDER — METFORMIN HCL ER 500 MG PO TB24: 500.0000 mg | ORAL_TABLET | Freq: Every day | ORAL | 3 refills | Status: DC

## 2016-10-12 MED ORDER — DULAGLUTIDE 0.75 MG/0.5ML ~~LOC~~ SOAJ: 0.7500 mg | SUBCUTANEOUS | 7 refills | Status: DC

## 2016-10-12 MED FILL — METFORMIN HCL ER 500 MG TAB: 500 | 30 days supply | Qty: 90 | Fill #0

## 2016-10-12 MED FILL — TRULICITY 0.75 MG/0.5 ML PE: 0.75 | 28 days supply | Qty: 2 | Fill #0

## 2016-10-12 NOTE — Addendum Note (Signed)
Addended by: Radene GunningWENDLING, NICHOLAS P on: 10/12/2016 11:13 AM   Modules accepted: Level of Service

## 2016-10-12 NOTE — Patient Instructions (Signed)
Aim to do some physical exertion for 150 minutes per week. This is typically divided into 5 days per week, 30 minutes per day. The activity should be enough to get your heart rate up. Anything is better than nothing if you have time constraints. ° ° °

## 2016-10-12 NOTE — Progress Notes (Signed)
Chief Complaint  Patient presents with  . Transitions Of Care    pt states her BS is running high-x 2 weeks-this am reading(244)    Subjective: Patient is a 31 y.o. female here for BS's running high.  The patient has a history of controlled type 2 diabetes. She currently takes metformin XR 1000 mg daily. She has been on 1000 mg XR the past, however it gave her diarrhea. Over the past 2 weeks, her fasting sugars have been in the 200s. This is atypical for her. There is no recent change in medication, recent new prescription filled, change in diet, change in activity, recent illness, fevers, rash, increased urination, or increased thirst/drinking.  ROS: GI: No N/V/pain GU: No polyuria  Family History  Problem Relation Age of Onset  . Diabetes Mother        Living  . Hypertension Mother   . Uterine cancer Mother        Pre-cancerous  . Thyroid disease Mother   . Diabetes type II Father        Living  . Hypertension Father   . Gout Father   . Neuropathy Father   . Breast cancer Maternal Grandmother   . Heart attack Paternal Grandfather   . Colon cancer Paternal Grandfather   . Colon cancer Maternal Grandfather   . Heart defect Brother   . Prostate cancer Maternal Uncle   . Alcoholism Other        Paternal Side  . Diabetes Maternal Aunt   . Diabetes Maternal Uncle    Past Medical History:  Diagnosis Date  . Diabetes mellitus type II, controlled (HCC)   . Diabetic neuropathy (HCC)   . Gallstones   . History of colonic polyps   . PCOS (polycystic ovarian syndrome)    Allergies  Allergen Reactions  . Plan B [Levonorgestrel] Anaphylaxis  . Progesterone Anaphylaxis  . Mobic [Meloxicam] Rash  . Pineapple Palpitations and Rash    Current Outpatient Prescriptions:  .  dicyclomine (BENTYL) 10 MG capsule, Take 10 mg by mouth 4 (four) times daily -  before meals and at bedtime. As Needed, Disp: , Rfl:  .  diphenhydrAMINE (SOMINEX) 25 MG tablet, Take 25 mg by mouth at bedtime  as needed. Benadryl, Disp: , Rfl:  .  EPINEPHrine 0.3 mg/0.3 mL IJ SOAJ injection, Inject 0.3 mLs (0.3 mg total) into the muscle once., Disp: 1 Device, Rfl: 0 .  escitalopram (LEXAPRO) 10 MG tablet, Take 10 mg by mouth daily., Disp: , Rfl:  .  gabapentin (NEURONTIN) 100 MG capsule, Take 2 capsules (200 mg total) by mouth at bedtime., Disp: 60 capsule, Rfl: 6 .  labetalol (NORMODYNE) 200 MG tablet, Take 200 mg by mouth once., Disp: , Rfl:  .  metFORMIN (GLUCOPHAGE-XR) 500 MG 24 hr tablet, Take 1 tablet (500 mg total) by mouth daily with breakfast. 2 tab po q hs, Disp: 90 tablet, Rfl: 3 .  ondansetron (ZOFRAN) 8 MG tablet, Take 1 tablet (8 mg total) by mouth every 8 (eight) hours as needed for nausea or vomiting., Disp: 20 tablet, Rfl: 0 .  promethazine (PHENERGAN) 25 MG tablet, Take 25 mg by mouth every 6 (six) hours as needed for nausea or vomiting., Disp: , Rfl:  .  tiZANidine (ZANAFLEX) 4 MG tablet, Take 1 tablet (4 mg total) by mouth every 6 (six) hours as needed for muscle spasms., Disp: 60 tablet, Rfl: 0 .  Dulaglutide (TRULICITY) 0.75 MG/0.5ML SOPN, Inject 0.75 mg into the skin once  a week., Disp: 2 pen, Rfl: 7  Objective: BP 110/82 (BP Location: Right Arm, Patient Position: Sitting, Cuff Size: Normal)   Pulse 86   Temp 98.7 F (37.1 C) (Oral)   Ht 5\' 7"  (1.702 m)   Wt 247 lb 6.4 oz (112.2 kg)   LMP 10/05/2016 (Exact Date)   SpO2 98%   BMI 38.75 kg/m  General: Awake, appears stated age HEENT: MMM, EOMi Heart: RRR, no murmurs, no LE edema Lungs: CTAB, no rales, wheezes or rhonchi. No accessory muscle use Abd: BS+, soft, NT, ND, no masses or organomegaly Psych: Age appropriate judgment and insight, normal affect and mood  Assessment and Plan: Controlled type 2 diabetes mellitus without complication, unspecified whether long term insulin use (HCC) - Plan: CBC, Comprehensive metabolic panel, Hemoglobin A1c, TSH, Microalbumin / creatinine urine ratio, metFORMIN (GLUCOPHAGE-XR) 500 MG  24 hr tablet, Dulaglutide (TRULICITY) 0.75 MG/0.5ML SOPN  Orders as above. Discussed increasing the dose of metformin to 750 mg XR vs adding something vs just lifestyle changes. She opted to add something. DDP-4, GLP-1 and SGLT-2 inhibitors offered, she elected for the weekly option. Continue to check sugars. Will check labs today. Follow-up in 4 weeks. Bring blood sugar log to appointment. The patient voiced understanding and agreement to the plan.  Jilda Roche [place], DO 10/12/16  11:10 AM

## 2016-11-05 ENCOUNTER — Telehealth: Payer: 59 | Admitting: Family

## 2016-11-05 ENCOUNTER — Other Ambulatory Visit: Payer: Self-pay | Admitting: Physician Assistant

## 2016-11-05 DIAGNOSIS — J019 Acute sinusitis, unspecified: Secondary | ICD-10-CM | POA: Diagnosis not present

## 2016-11-05 DIAGNOSIS — B9689 Other specified bacterial agents as the cause of diseases classified elsewhere: Secondary | ICD-10-CM | POA: Diagnosis not present

## 2016-11-05 DIAGNOSIS — T782XXA Anaphylactic shock, unspecified, initial encounter: Secondary | ICD-10-CM

## 2016-11-05 MED ORDER — EPINEPHRINE 0.3 MG/0.3ML IJ SOAJ: 0.3000 mg | Freq: Once | INTRAMUSCULAR | 0 refills | Status: AC

## 2016-11-05 MED ORDER — AMOXICILLIN-POT CLAVULANATE 875-125 MG PO TABS: 1.0000 | ORAL_TABLET | Freq: Two times a day (BID) | ORAL | 0 refills | Status: DC

## 2016-11-05 MED FILL — ESCITALOPRAM 10 MG TABLET: 10 | 90 days supply | Qty: 90 | Fill #2

## 2016-11-05 MED FILL — AMOX-CLAV 875-125 MG TABLET: 875-125 | 7 days supply | Qty: 14 | Fill #0

## 2016-11-05 MED FILL — TRULICITY 0.75 MG/0.5 ML PE: 0.75 | 28 days supply | Qty: 2 | Fill #1

## 2016-11-05 MED FILL — EPIPEN 2-PAK 0.3 MG AUTO-IN: 0.3 | 2 days supply | Qty: 2 | Fill #0

## 2016-11-05 NOTE — Progress Notes (Signed)

## 2016-11-05 NOTE — Telephone Encounter (Signed)
Rx approved and sent to the pharmacy by e-script.//AB/CMA 

## 2016-11-09 ENCOUNTER — Ambulatory Visit (INDEPENDENT_AMBULATORY_CARE_PROVIDER_SITE_OTHER): Payer: 59 | Admitting: Family Medicine

## 2016-11-09 ENCOUNTER — Encounter: Payer: Self-pay | Admitting: Family Medicine

## 2016-11-09 VITALS — BP 128/84 | HR 83 | Temp 98.3°F | Ht 67.0 in | Wt 249.4 lb

## 2016-11-09 DIAGNOSIS — Z9109 Other allergy status, other than to drugs and biological substances: Secondary | ICD-10-CM

## 2016-11-09 DIAGNOSIS — E1165 Type 2 diabetes mellitus with hyperglycemia: Secondary | ICD-10-CM | POA: Diagnosis not present

## 2016-11-09 DIAGNOSIS — E1142 Type 2 diabetes mellitus with diabetic polyneuropathy: Secondary | ICD-10-CM | POA: Diagnosis not present

## 2016-11-09 MED ORDER — LEVOCETIRIZINE DIHYDROCHLORIDE 5 MG PO TABS: 5.0000 mg | ORAL_TABLET | Freq: Every evening | ORAL | 5 refills | Status: DC

## 2016-11-09 MED ORDER — DULAGLUTIDE 1.5 MG/0.5ML ~~LOC~~ SOAJ: SUBCUTANEOUS | 3 refills | Status: DC

## 2016-11-09 MED FILL — TRULICITY 1.5 MG/0.5 ML PEN: 1.5 | 28 days supply | Qty: 2 | Fill #0

## 2016-11-09 MED FILL — LEVOCETIRIZINE 5 MG TABLET: 5 | 30 days supply | Qty: 30 | Fill #0

## 2016-11-09 NOTE — Progress Notes (Signed)
Subjective:   Chief Complaint  Patient presents with  . Follow-up    Patient is here today for a F/U on new medication start of Trulicity.    Veronica Mclaughlin is a 31 y.o. female here for follow-up of diabetes.   Veronica Mclaughlin's self monitored glucose range is 150's in AMAnd in the 200s after meals, this is markedly improved from prior to starting the Trulicity.. Denies hypoglycemic reactions. She checks her glucose levels 1 times per day. Patient does not require insulin.   Medications include: Metformin XR 1000 mg daily, Trulicity 0.75 mg weekly recently started Patient has been walking.  Past Medical History:  Diagnosis Date  . Diabetes mellitus type II, controlled (HCC)   . Diabetic neuropathy (HCC)   . Gallstones   . History of colonic polyps   . PCOS (polycystic ovarian syndrome)     Past Surgical History:  Procedure Laterality Date  . CHOLECYSTECTOMY  2014  . COLONOSCOPY  2005    Social History   Social History  . Marital status: Married   Social History Main Topics  . Smoking status: Never Smoker  . Smokeless tobacco: Never Used  . Alcohol use 0.0 oz/week     Comment: 1 glass of wine a month  . Drug use: No  . Sexual activity: Yes    Partners: Male   Social History Narrative   Lives with husband in a one story home.  No children.     Works as an Museum/gallery exhibitions officer.     Education: associates degree.    Current Outpatient Prescriptions on File Prior to Visit  Medication Sig Dispense Refill  . amoxicillin-clavulanate (AUGMENTIN) 875-125 MG tablet Take 1 tablet by mouth 2 (two) times daily. 14 tablet 0  . diphenhydrAMINE (SOMINEX) 25 MG tablet Take 25 mg by mouth at bedtime as needed. Benadryl    . Dulaglutide (TRULICITY) 0.75 MG/0.5ML SOPN Inject 0.75 mg into the skin once a week. 2 pen 7  . escitalopram (LEXAPRO) 10 MG tablet Take 10 mg by mouth daily.    Marland Kitchen gabapentin (NEURONTIN) 100 MG capsule Take 2 capsules (200 mg total) by mouth at bedtime. 60 capsule 6  . metFORMIN  (GLUCOPHAGE-XR) 500 MG 24 hr tablet Take 1 tablet (500 mg total) by mouth daily with breakfast. 2 tab po q hs 90 tablet 3  . tiZANidine (ZANAFLEX) 4 MG tablet Take 1 tablet (4 mg total) by mouth every 6 (six) hours as needed for muscle spasms. 60 tablet 0    Related testing: Foot exam(monofilament and inspection):done Retinal exam:done Date of retinal exam: 08/2016  Done by:  [facility] Pneumovax: Refuses  Review of Systems: Eye:  No recent significant change in vision Pulmonary:  No SOB Cardiovascular:  No chest pain, no palpitations Skin/Integumentary ROS:  No abnormal skin lesions reported Neurologic:  No numbness, tingling  Objective:  BP 128/84 (BP Location: Right Arm, Patient Position: Sitting, Cuff Size: Large)   Pulse 83   Temp 98.3 F (36.8 C) (Oral)   Ht 5\' 7"  (1.702 m)   Wt 249 lb 6.4 oz (113.1 kg)   SpO2 98%   BMI 39.06 kg/m  General:  Well developed, well nourished, in no apparent distress Skin:  Warm, no pallor or diaphoresis Head:  Normocephalic, atraumatic Eyes:  Pupils equal and round, sclera anicteric without injection  Throat/Pharynx:  Lips and gingiva without lesion Neck: Neck supple.  No obvious thyromegaly or masses.  No bruits Lungs:  clear to auscultation, breath sounds equal bilaterally, no  wheezes, rales, or stridor Cardio:  regular rate and rhythm without murmurs, no bruits, no LE edema Neuro:  Sensation intact to pinprick on feet Psych: Age appropriate judgment and insight  Assessment:   Uncontrolled type 2 diabetes mellitus with diabetic polyneuropathy, unspecified whether long term insulin use (HCC) - Plan: Dulaglutide (TRULICITY) 1.5 MG/0.5ML SOPN, HM Diabetes Foot Exam  Environmental allergies - Plan: levocetirizine (XYZAL) 5 MG tablet   Plan:   Orders as above. Improving. Will increase Trulicity to 1.5 mg weekly as she is tolerating well and not quite at goal given her home readings. Counseled on diet and exercise. F/u in 2 mo. The  patient voiced understanding and agreement to the plan.  Jilda Roche [place], DO 11/09/16 11:38 AM

## 2016-11-09 NOTE — Patient Instructions (Addendum)
Keep checking sugars at least 3-4 times per week.  Take 2 pens of Trulicity until you run out.  Claritin (loratadine), Allegra (fexofenadine), Zyrtec (cetirizine); these are listed in order from weakest to strongest. Generic, and therefore cheaper, options are in the parentheses.   Flonase (fluticasone); nasal spray that is over the counter. 2 sprays each nostril, once daily. Aim towards the same side eye when you spray.  There are available OTC, and the generic versions, which may be cheaper, are in parentheses. Show this to a pharmacist if you have trouble finding any of these items.   Let us know if you need anything.

## 2016-12-11 MED FILL — TRULICITY 1.5 MG/0.5 ML PEN: 1.5 | 28 days supply | Qty: 2 | Fill #1

## 2016-12-11 MED FILL — LABETALOL HCL 200 MG TABLET: 200 | 90 days supply | Qty: 90 | Fill #1

## 2016-12-11 MED FILL — LEVOCETIRIZINE 5 MG TABLET: 5 | 30 days supply | Qty: 30 | Fill #1

## 2016-12-31 DIAGNOSIS — Z888 Allergy status to other drugs, medicaments and biological substances status: Secondary | ICD-10-CM | POA: Diagnosis not present

## 2016-12-31 DIAGNOSIS — R1031 Right lower quadrant pain: Secondary | ICD-10-CM | POA: Diagnosis not present

## 2016-12-31 DIAGNOSIS — G8929 Other chronic pain: Secondary | ICD-10-CM | POA: Diagnosis not present

## 2016-12-31 DIAGNOSIS — Z91018 Allergy to other foods: Secondary | ICD-10-CM | POA: Diagnosis not present

## 2016-12-31 DIAGNOSIS — M545 Low back pain: Secondary | ICD-10-CM | POA: Diagnosis not present

## 2016-12-31 DIAGNOSIS — R739 Hyperglycemia, unspecified: Secondary | ICD-10-CM | POA: Diagnosis not present

## 2016-12-31 DIAGNOSIS — K862 Cyst of pancreas: Secondary | ICD-10-CM | POA: Diagnosis not present

## 2017-01-01 MED FILL — CYCLOBENZAPRINE 10 MG TAB: 10 | 10 days supply | Qty: 20 | Fill #0

## 2017-01-01 MED FILL — SULINDAC 200 MG TABLET: 200 | 15 days supply | Qty: 30 | Fill #0

## 2017-01-03 ENCOUNTER — Ambulatory Visit: Payer: Self-pay | Admitting: Family Medicine

## 2017-01-07 DIAGNOSIS — Z6834 Body mass index (BMI) 34.0-34.9, adult: Secondary | ICD-10-CM | POA: Diagnosis not present

## 2017-01-07 DIAGNOSIS — E669 Obesity, unspecified: Secondary | ICD-10-CM | POA: Diagnosis not present

## 2017-01-07 DIAGNOSIS — K862 Cyst of pancreas: Secondary | ICD-10-CM | POA: Diagnosis not present

## 2017-01-08 MED FILL — LEVOCETIRIZINE 5 MG TABLET: 5 | 30 days supply | Qty: 30 | Fill #2

## 2017-01-08 MED FILL — METFORMIN HCL ER 500 MG TAB: 500 | 30 days supply | Qty: 90 | Fill #1

## 2017-01-08 MED FILL — TRULICITY 1.5 MG/0.5 ML PEN: 1.5 | 28 days supply | Qty: 2 | Fill #2

## 2017-01-11 ENCOUNTER — Ambulatory Visit (INDEPENDENT_AMBULATORY_CARE_PROVIDER_SITE_OTHER): Payer: 59 | Admitting: Family Medicine

## 2017-01-11 ENCOUNTER — Encounter: Payer: Self-pay | Admitting: Family Medicine

## 2017-01-11 VITALS — BP 125/86 | HR 68 | Temp 98.4°F | Ht 68.0 in | Wt 250.6 lb

## 2017-01-11 DIAGNOSIS — K862 Cyst of pancreas: Secondary | ICD-10-CM

## 2017-01-11 DIAGNOSIS — E1165 Type 2 diabetes mellitus with hyperglycemia: Secondary | ICD-10-CM | POA: Diagnosis not present

## 2017-01-11 LAB — HEMOGLOBIN A1C: HEMOGLOBIN A1C: 7.9 % — AB (ref 4.6–6.5)

## 2017-01-11 MED ORDER — METFORMIN HCL ER 500 MG PO TB24: 1000.0000 mg | ORAL_TABLET | Freq: Every day | ORAL | 1 refills | Status: DC

## 2017-01-11 NOTE — Patient Instructions (Addendum)
Take 2 tabs of Metformin daily.   Start lifting weights.  Keep up the great work with your weight loss efforts.  I will contact the radiology team regarding the correct test for your pancreatic cyst and put the order in today.   Let us know if you need anything.

## 2017-01-11 NOTE — Progress Notes (Signed)
Subjective:   Chief Complaint  Patient presents with  . Follow-up    Diabetes    Veronica Mclaughlin is a 31 y.o. female here for follow-up of diabetes.   Clayton's self monitored glucose range is same as before in high 100's vs low 200's.  Patient denies hypoglycemic reactions. She checks her glucose levels 1 time per day. Patient does not require insulin.   Medications include: Metformin XR 500 mg/d, Trulicity 1.5 mg/week Patient walks. States diet is healthy overall.  The patient was at the emergency department on 12/31/16. She underwent a CT of the abdomen/pelvis and was found to have a 1.9 cm enlarging pancreas cyst. MRI follow-up is recommended. She's not having abdominal pain, nausea, or vomiting. She has intentionally lost 4 pounds. She has have a family history pancreatic Cancer.  Past Medical History:  Diagnosis Date  . Diabetes mellitus type II, controlled (HCC)   . Diabetic neuropathy (HCC)   . Gallstones   . History of colonic polyps   . PCOS (polycystic ovarian syndrome)     Past Surgical History:  Procedure Laterality Date  . CHOLECYSTECTOMY  2014  . COLONOSCOPY  2005    Social History   Social History  . Marital status: Married   Social History Main Topics  . Smoking status: Never Smoker  . Smokeless tobacco: Never Used  . Alcohol use 0.0 oz/week     Comment: 1 glass of wine a month  . Drug use: No  . Sexual activity: Yes    Partners: Male   Social History Narrative   Lives with husband in a one story home.  No children.     Works as an Museum/gallery exhibitions officerMT.     Education: associates degree.    Current Outpatient Prescriptions on File Prior to Visit  Medication Sig Dispense Refill  . diphenhydrAMINE (SOMINEX) 25 MG tablet Take 25 mg by mouth at bedtime as needed. Benadryl    . Dulaglutide (TRULICITY) 1.5 MG/0.5ML SOPN Inject 1.5 mg (0.5 mL) weekly. 4 pen 3  . escitalopram (LEXAPRO) 10 MG tablet Take 10 mg by mouth daily.    Marland Kitchen. levocetirizine (XYZAL) 5 MG tablet Take 1  tablet (5 mg total) by mouth every evening. 30 tablet 5  . metFORMIN (GLUCOPHAGE-XR) 500 MG 24 hr tablet Take 1 tablet (500 mg total) by mouth daily with breakfast. 2 tab po q hs 90 tablet 3  . tiZANidine (ZANAFLEX) 4 MG tablet Take 1 tablet (4 mg total) by mouth every 6 (six) hours as needed for muscle spasms. (Patient not taking: Reported on 01/11/2017) 60 tablet 0   Review of Systems: Pulmonary:  No SOB Cardiovascular:  No chest pain, no palpitations Skin/Integumentary ROS:  No abnormal skin lesions reported  Objective:  BP 125/86   Pulse 68   Temp 98.4 F (36.9 C) (Oral)   Ht 5\' 8"  (1.727 m)   Wt 250 lb 9.6 oz (113.7 kg)   LMP 12/07/2016   SpO2 99%   Breastfeeding? No   BMI 38.10 kg/m  General:  Well developed, well nourished, in no apparent distress Skin:  Warm, no pallor or diaphoresis Head:  Normocephalic, atraumatic Eyes:  Pupils equal and round, sclera anicteric without injection  Nose:  External nares without trauma, no discharge Throat/Pharynx:  Lips and gingiva without lesion Neck: Neck supple.  No obvious thyromegaly or masses.  No bruits Lungs:  clear to auscultation, breath sounds equal bilaterally, no wheezes, rales, or stridor Cardio:  regular rate and rhythm without murmurs,  no bruits, no LE edema Abdomen:  Abdomen soft, non-tender, BS normal Musculoskeletal:  Symmetrical muscle groups noted without atrophy or deformity Neuro:  Sensation intact to pinprick on feet Psych: Age appropriate judgment and insight  Assessment:   Uncontrolled type 2 diabetes mellitus with hyperglycemia (HCC) - Plan: metFORMIN (GLUCOPHAGE-XR) 500 MG 24 hr tablet, Hemoglobin A1c  Cyst of pancreas - Plan: MR ABDOMEN W CONTRAST   Plan:   Orders as above. Needs MRI for pancreatic cyst, MRI abd w contrast is protocol for pancreas.  08/2016- [facility] Refuses Pneumonia vaccine Has to get Flu shot. Foot exam 10/2016. F/u in 3 mo. The patient voiced understanding and agreement to the  plan.  Jilda Roche [place], DO 01/11/17 12:22 PM

## 2017-01-11 NOTE — Progress Notes (Signed)
Pre visit review using our clinic tool,if applicable. No additional management support is needed unless otherwise documented below in the visit note.  

## 2017-01-19 ENCOUNTER — Ambulatory Visit (HOSPITAL_BASED_OUTPATIENT_CLINIC_OR_DEPARTMENT_OTHER)
Admission: RE | Admit: 2017-01-19 | Discharge: 2017-01-19 | Disposition: A | Discharge status: Home / Self Care | Payer: 59 | Source: Ambulatory Visit | Attending: Family Medicine | Admitting: Family Medicine

## 2017-01-19 DIAGNOSIS — K862 Cyst of pancreas: Secondary | ICD-10-CM | POA: Insufficient documentation

## 2017-01-19 DIAGNOSIS — Z9049 Acquired absence of other specified parts of digestive tract: Secondary | ICD-10-CM | POA: Diagnosis not present

## 2017-01-19 DIAGNOSIS — K760 Fatty (change of) liver, not elsewhere classified: Secondary | ICD-10-CM | POA: Insufficient documentation

## 2017-01-19 MED ORDER — GADOBENATE DIMEGLUMINE 529 MG/ML IV SOLN
20.0000 mL | Freq: Once | INTRAVENOUS | Status: AC | PRN
  Administered 2017-01-19: 20 mL via INTRAVENOUS

## 2017-01-23 ENCOUNTER — Encounter: Payer: Self-pay | Admitting: Family Medicine

## 2017-01-23 DIAGNOSIS — G44209 Tension-type headache, unspecified, not intractable: Secondary | ICD-10-CM

## 2017-01-24 MED ORDER — TIZANIDINE HCL 4 MG PO TABS: 4.0000 mg | ORAL_TABLET | Freq: Four times a day (QID) | ORAL | 1 refills | Status: DC | PRN

## 2017-01-24 MED FILL — tiZANidine HCL 4 MG TABS: 4 | 15 days supply | Qty: 60 | Fill #0

## 2017-01-29 ENCOUNTER — Encounter: Payer: Self-pay | Admitting: Family Medicine

## 2017-01-30 ENCOUNTER — Other Ambulatory Visit: Payer: Self-pay | Admitting: Family Medicine

## 2017-01-30 ENCOUNTER — Telehealth: Payer: Self-pay

## 2017-01-30 MED ORDER — FREESTYLE LIBRE SENSOR SYSTEM MISC: 11 refills | Status: DC

## 2017-01-30 NOTE — Telephone Encounter (Signed)
PA initiated for [employer] via [employer]; KEY: [id number]. Awaiting determination  PA also initiated via [employer] for Freestyle Libre sensors (3); KEY: [id number]. Awaiting determination.

## 2017-01-30 NOTE — Progress Notes (Signed)
CGM called in per patient request. [facility] was [facility].

## 2017-01-31 MED FILL — TRULICITY 1.5 MG/0.5 ML PEN: 1.5 | 28 days supply | Qty: 2 | Fill #3

## 2017-02-01 DIAGNOSIS — Z800 Family history of malignant neoplasm of digestive organs: Secondary | ICD-10-CM | POA: Diagnosis not present

## 2017-02-01 DIAGNOSIS — K869 Disease of pancreas, unspecified: Secondary | ICD-10-CM | POA: Diagnosis not present

## 2017-02-01 DIAGNOSIS — K760 Fatty (change of) liver, not elsewhere classified: Secondary | ICD-10-CM | POA: Diagnosis not present

## 2017-02-07 NOTE — Telephone Encounter (Signed)
PA denied. Continuous glucose monitors require that the patient is insulin dependent defined by 3 or more daily injections of insulin or utilizes a continuous insulin infusion pump.

## 2017-02-08 MED FILL — LEVOCETIRIZINE 5 MG TABLET: 5 | 30 days supply | Qty: 30 | Fill #3

## 2017-02-08 MED FILL — ESCITALOPRAM 10 MG TABLET: 10 | 90 days supply | Qty: 90 | Fill #3

## 2017-02-18 ENCOUNTER — Telehealth: Payer: Self-pay | Admitting: Gastroenterology

## 2017-02-18 NOTE — Telephone Encounter (Signed)
Received records from [facility] and placed on Dr. Ardell IsaacsStark's desk for review (Doc of Day on 02-04-17).  Patient is requesting to switch to [facility] because she would like a 2nd opinion.

## 2017-02-22 NOTE — Telephone Encounter (Signed)
Records on Dr Christella HartiganJacobs desk for review.  Dr Russella DarStark states potential for EUS

## 2017-02-22 NOTE — Telephone Encounter (Signed)
Agree.  Patty, see below.  Thanks

## 2017-02-22 NOTE — Telephone Encounter (Signed)
I reviewed records.  I don't think she needs EUS (per AGA criteria for incidental panc cyst this could be followed by MR) but I'm happy to see her for second opinion either way.  Ok to book for my next available new gi appt (not with extender and do not double book) if that is OK with Dr. Russella DarStark.

## 2017-02-22 NOTE — Telephone Encounter (Signed)
Given her situation I thought it would be ideal for you to be the one to evaluate, discuss and follow. Thank you DJ.

## 2017-02-25 NOTE — Telephone Encounter (Signed)
04/15/17 11:15 am appt with Dr Christella HartiganJacobs Left message on machine to call back

## 2017-02-25 NOTE — Telephone Encounter (Signed)
Patient called back confirmed appointment states will be here on 04/15/17 and wants to be put on the cancellation list.

## 2017-02-26 NOTE — Telephone Encounter (Signed)
Noted  

## 2017-03-04 ENCOUNTER — Other Ambulatory Visit: Payer: Self-pay

## 2017-03-04 NOTE — Patient Outreach (Signed)
[facility] Care Management  03/04/2017  [name] 09/11/1985 [id number]  Case closed in Epic.  Member is being followed by the [employer] program Member enrolled in an external program. Dudley MajorMelissa Dhwani Venkatesh RN, [facility]M Care Management Coordinator-[facility] to [facility] Care [facility] [phone]

## 2017-03-12 ENCOUNTER — Other Ambulatory Visit: Payer: Self-pay | Admitting: Family Medicine

## 2017-03-12 DIAGNOSIS — IMO0002 Reserved for concepts with insufficient information to code with codable children: Secondary | ICD-10-CM

## 2017-03-12 DIAGNOSIS — E1165 Type 2 diabetes mellitus with hyperglycemia: Principal | ICD-10-CM

## 2017-03-12 DIAGNOSIS — E1142 Type 2 diabetes mellitus with diabetic polyneuropathy: Secondary | ICD-10-CM

## 2017-03-12 MED FILL — LEVOCETIRIZINE 5 MG TABLET: 5 | 30 days supply | Qty: 30 | Fill #4

## 2017-03-12 MED FILL — TRULICITY 1.5 MG/0.5 ML PEN: 1.5 | 28 days supply | Qty: 2 | Fill #0

## 2017-03-14 MED FILL — LABETALOL HCL 200 MG TABLET: 200 | 90 days supply | Qty: 90 | Fill #2

## 2017-03-14 MED FILL — METFORMIN HCL ER 500 MG TAB: 500 | 90 days supply | Qty: 180 | Fill #0

## 2017-03-21 ENCOUNTER — Ambulatory Visit (INDEPENDENT_AMBULATORY_CARE_PROVIDER_SITE_OTHER): Payer: 59 | Admitting: Family Medicine

## 2017-03-21 ENCOUNTER — Encounter: Payer: Self-pay | Admitting: Family Medicine

## 2017-03-21 VITALS — BP 112/80 | HR 79 | Temp 99.0°F | Ht 68.0 in | Wt 242.5 lb

## 2017-03-21 DIAGNOSIS — B9789 Other viral agents as the cause of diseases classified elsewhere: Secondary | ICD-10-CM

## 2017-03-21 DIAGNOSIS — J069 Acute upper respiratory infection, unspecified: Secondary | ICD-10-CM | POA: Diagnosis not present

## 2017-03-21 DIAGNOSIS — E1165 Type 2 diabetes mellitus with hyperglycemia: Secondary | ICD-10-CM

## 2017-03-21 DIAGNOSIS — G4700 Insomnia, unspecified: Secondary | ICD-10-CM

## 2017-03-21 MED ORDER — DAPAGLIFLOZIN PROPANEDIOL 5 MG PO TABS: 5.0000 mg | ORAL_TABLET | Freq: Every day | ORAL | 1 refills | Status: DC

## 2017-03-21 MED ORDER — TRAZODONE HCL 50 MG PO TABS: 25.0000 mg | ORAL_TABLET | Freq: Every evening | ORAL | 3 refills | Status: AC | PRN

## 2017-03-21 MED ORDER — METFORMIN HCL ER 750 MG PO TB24
750.0000 mg | ORAL_TABLET | Freq: Every day | ORAL | 3 refills | Status: AC
Start: 2017-03-21 — End: ?

## 2017-03-21 MED ORDER — IPRATROPIUM BROMIDE 0.03 % NA SOLN: 2.0000 | Freq: Two times a day (BID) | NASAL | 12 refills | Status: AC

## 2017-03-21 MED FILL — traZODone HCL 50 MG TABS: 50 | 30 days supply | Qty: 30 | Fill #0

## 2017-03-21 MED FILL — IPRATROPIUM 0.03% SPRAY: 0.03 | 43 days supply | Qty: 30 | Fill #0

## 2017-03-21 MED FILL — METFORMIN HCL ER 750 MG TAB: 750 | 90 days supply | Qty: 90 | Fill #0

## 2017-03-21 MED FILL — FARXIGA 5 MG TABLET: 5 | 90 days supply | Qty: 90 | Fill #0

## 2017-03-21 NOTE — Progress Notes (Signed)
Pre visit review using our clinic review tool, if applicable. No additional management support is needed unless otherwise documented below in the visit note. 

## 2017-03-21 NOTE — Progress Notes (Signed)
Subjective:   Chief Complaint  Patient presents with  . Follow-up    diabetes    Veronica Mclaughlin is a 31 y.o. female here for follow-up of diabetes.   Kaytie's self monitored glucose range is low-mid 100's.  Patient denies hypoglycemic reactions. Patient does not require insulin.   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  She will have difficulty falling asleep.  She used to take Benadryl for this.  This no longer works.  She used to be on trazodone and that worked well, however she was told she cannot take it due to being on gabapentin concurrently.  She is on Lexapro 10 mg daily.  She is not on any other medicines that affect serotonin.  Past Medical History:  Diagnosis Date  . Diabetes mellitus type II, controlled (HCC)   . Diabetic neuropathy (HCC)   . Gallstones   . History of colonic polyps   . PCOS (polycystic ovarian syndrome)     Past Surgical History:  Procedure Laterality Date  . CHOLECYSTECTOMY  2014  . COLONOSCOPY  2005    Social History   Socioeconomic History  . Marital status: Married  Tobacco Use  . Smoking status: Never Smoker  . Smokeless tobacco: Never Used  Substance and Sexual Activity  . Alcohol use: Yes    Alcohol/week: 0.0 oz    Comment: 1 glass of wine a month  . Drug use: No  . Sexual activity: Yes    Partners: Male  Social History Narrative   Lives with husband in a one story home.  No children.     Works as an Museum/gallery exhibitions officerMT.     Education: associates degree.    Current Outpatient Medications on File Prior to Visit  Medication Sig Dispense Refill  . Continuous Blood Gluc Sensor (FREESTYLE  LIBRE SENSOR SYSTEM) MISC Apply every 14 days to check sugars. 2 each 11  . diphenhydrAMINE (SOMINEX) 25 MG tablet Take 25 mg by mouth at bedtime as needed. Benadryl    . escitalopram (LEXAPRO) 10 MG tablet Take 10 mg by mouth daily.    Marland Kitchen. levocetirizine (XYZAL) 5 MG tablet Take 1 tablet (5 mg total) by mouth every evening. 30 tablet 5  . metFORMIN (GLUCOPHAGE-XR) 500 MG 24 hr tablet Take 2 tablets (1,000 mg total) by mouth daily with breakfast. 180 tablet 1  . tiZANidine (ZANAFLEX) 4 MG tablet Take 1 tablet (4 mg total) by mouth every 6 (six) hours as needed for muscle spasms. 60 tablet 1  . TRULICITY 1.5 MG/0.5ML SOPN INJECT 1.5 MG (0.5 ML) WEEKLY. 2 mL 3   Review of Systems: Eye:  No recent significant change in vision Pulmonary:  No SOB Psych: +insomnia  Objective:  BP 112/80 (BP Location: Left Arm, Patient Position: Sitting, Cuff Size: Large)   Pulse 79   Temp 99 F (37.2 C) (Oral)   Ht 5\' 8"  (1.727 m)   Wt 242 lb 8 oz (110 kg)   SpO2 97%   BMI 36.87 kg/m  General:  Well developed, well nourished, in no apparent distress Skin:  Warm, no pallor or diaphoresis Head:  Normocephalic, atraumatic Ears: Neg b/l Eyes:  Pupils equal and round, sclera anicteric without injection  Nose:  External nares without trauma, no discharge Throat/Pharynx:  Lips and gingiva without lesion Neck: Neck supple.  No obvious thyromegaly or masses.  No bruits Lungs:  clear to auscultation, breath sounds equal bilaterally, no wheezes, rales, or stridor Cardio:  regular rate and rhythm without murmurs, no bruits, no LE edema Psych: Age appropriate judgment and insight  Assessment:   Insomnia, unspecified type - Plan: traZODone (DESYREL) 50 MG tablet  Uncontrolled type 2 diabetes mellitus with hyperglycemia (HCC) - Plan: metFORMIN (GLUCOPHAGE-XR) 750 MG 24 hr tablet, dapagliflozin propanediol (FARXIGA) 5 MG TABS tablet  Viral URI with cough   Plan:   Last A1c was 7.9 in October.  Continue on  Trulicity and metformin.  We will add Farsi.  Payment card given.  Counseled on diet and exercise. We will restart trazodone.  She is on a low-dose of Lexapro and usually takes half a tab of trazodone.  Risk of serotonin syndrome is low. Her lungs are clear and she is not having any fever or rigors.  Supportive care. F/u in 2 mo. The patient voiced understanding and agreement to the plan.  Jilda Rocheicholas Paul QuentinWendling, DO 03/21/17 12:20 PM

## 2017-03-21 NOTE — Patient Instructions (Addendum)
Keep the diet clean.   Aim to do some physical exertion for 150 minutes per week. This is typically divided into 5 days per week, 30 minutes per day. The activity should be enough to get your heart rate up. Anything is better than nothing if you have time constraints.  Let me know if anything is too expensive regarding your medicines.  Let us know if you need anything.

## 2017-03-24 ENCOUNTER — Encounter: Payer: Self-pay | Admitting: Family Medicine

## 2017-03-25 ENCOUNTER — Other Ambulatory Visit: Payer: Self-pay | Admitting: Family Medicine

## 2017-03-25 MED ORDER — FLUCONAZOLE 150 MG PO TABS: 150.0000 mg | ORAL_TABLET | Freq: Once | ORAL | 0 refills | Status: AC

## 2017-03-25 MED FILL — FLUCONAZOLE 150 MG TABLET: 150 | 1 days supply | Qty: 1 | Fill #0

## 2017-03-25 NOTE — Progress Notes (Unsigned)
Diflucan called in.

## 2017-04-02 ENCOUNTER — Other Ambulatory Visit: Payer: Self-pay | Admitting: Family Medicine

## 2017-04-02 MED ORDER — PIOGLITAZONE HCL 30 MG PO TABS: 30.0000 mg | ORAL_TABLET | Freq: Every day | ORAL | 2 refills | Status: DC

## 2017-04-02 MED FILL — PIOGLITAZONE HCL 30 MG TAB: 30 | 30 days supply | Qty: 30 | Fill #0

## 2017-04-03 MED FILL — TRULICITY 1.5 MG/0.5 ML PEN: 1.5 | 28 days supply | Qty: 2 | Fill #1

## 2017-04-15 ENCOUNTER — Encounter: Payer: Self-pay | Admitting: Gastroenterology

## 2017-04-15 ENCOUNTER — Ambulatory Visit (INDEPENDENT_AMBULATORY_CARE_PROVIDER_SITE_OTHER): Payer: 59 | Admitting: Gastroenterology

## 2017-04-15 VITALS — BP 128/92 | HR 84 | Ht 68.0 in | Wt 245.1 lb

## 2017-04-15 DIAGNOSIS — Z86010 Personal history of colonic polyps: Secondary | ICD-10-CM

## 2017-04-15 DIAGNOSIS — K862 Cyst of pancreas: Secondary | ICD-10-CM | POA: Diagnosis not present

## 2017-04-15 DIAGNOSIS — K760 Fatty (change of) liver, not elsewhere classified: Secondary | ICD-10-CM | POA: Diagnosis not present

## 2017-04-15 NOTE — Progress Notes (Signed)
HPI: This is a very pleasant 32 year old woman who was referred to me by Sharlene Dory*  to evaluate pancreatic cyst  Chief complaint is pancreatic cyst, RUQ bloating intermittently.  Labs 03/2017: cbc and CMET were both normal except gluc 214, alt 70, ast 42; just drawn for lab   She'd seen Dr. Dulce Sellar previously and was not happy with his recommendation.  Her M GF had pancreatic cancer.  Her M Uncle had acute pancreatitis.  She has had two colonoscopies in the past; was told she had 5 polyps and then 4 polyps.  These were [facility] in [place].  Old Data Reviewed: MRI with MRCP 12/2016 (local): "Pancreas: A simple appearing cyst is seen in the proximal pancreatic body which measures 2.1 x 1.8 cm. This shows probable communication with the main pancreatic duct, which is nondilated. No evidence of pancreas divisum."  CT scan abd/pelvis with IV and oral contrast 12/2016 ([facility]): For back pains: "There is a 2 cm cystic focus in the pancreatic headache, double in size in retrospect when compared to 2013".  CT scan with iv and oral contrast 06/2014 (local):  There was no mention of pancreatic cyst by the radiologist report however I personally reviewed these images and there does appear to be a 1-2 cm hypoechoic region and about the neck of the pancreas, same location as the more recent MRI shows a cyst.  Hb A1c 12/2016 7.9  Overall weight down 3 pounds, generally in same 240-245 range.  Vs. 1 year ago she is up 20 pounds.  Lap chole 2014, in [place], went smoothly.  About 10 days ago she had intense RUQ pain, doubled over.  Felt like gallstone pains.  Also intermittent bloating.  Review of systems: Pertinent positive and negative review of systems were noted in the above HPI section. All other review negative.   Past Medical History:  Diagnosis Date  . Diabetes mellitus type II, controlled (HCC)   . Diabetic neuropathy (HCC)   . Gallstones   . History of  colonic polyps   . HTN (hypertension)   . Pancreatic cyst   . PCOS (polycystic ovarian syndrome)     Past Surgical History:  Procedure Laterality Date  . CHOLECYSTECTOMY  2014  . COLONOSCOPY  2005  . EXPLORATORY LAPAROTOMY     x 2 for PCOS    Current Outpatient Medications  Medication Sig Dispense Refill  . diphenhydrAMINE (SOMINEX) 25 MG tablet Take 25 mg by mouth at bedtime as needed. Benadryl    . escitalopram (LEXAPRO) 10 MG tablet Take 10 mg by mouth daily.    Marland Kitchen ipratropium (ATROVENT) 0.03 % nasal spray Place 2 sprays into both nostrils every 12 (twelve) hours. 30 mL 12  . metFORMIN (GLUCOPHAGE-XR) 750 MG 24 hr tablet Take 1 tablet (750 mg total) by mouth daily with breakfast. 90 tablet 3  . pioglitazone (ACTOS) 30 MG tablet Take 1 tablet (30 mg total) by mouth daily. 30 tablet 2  . tiZANidine (ZANAFLEX) 4 MG tablet Take 1 tablet (4 mg total) by mouth every 6 (six) hours as needed for muscle spasms. 60 tablet 1  . traZODone (DESYREL) 50 MG tablet Take 0.5-1 tablets (25-50 mg total) by mouth at bedtime as needed for sleep. 30 tablet 3  . TRULICITY 1.5 MG/0.5ML SOPN INJECT 1.5 MG (0.5 ML) WEEKLY. 2 mL 3   No current facility-administered medications for this visit.     Allergies as of 04/15/2017 - Review Complete 04/15/2017  Allergen Reaction Noted  .  Plan b [levonorgestrel] Anaphylaxis 04/10/2014  . Progesterone Anaphylaxis 04/10/2014  . Mobic [meloxicam] Rash 04/10/2014  . Pineapple Palpitations and Rash 04/10/2014    Family History  Problem Relation Age of Onset  . Diabetes Mother   . Hypertension Mother   . Uterine cancer Mother   . Thyroid disease Mother   . Diabetes type II Father   . Hypertension Father   . Gout Father   . Neuropathy Father   . Breast cancer Maternal Grandmother   . Heart attack Paternal Grandfather [id number]  . Colon cancer Maternal Grandfather   . Pancreatic cancer Maternal Grandfather   . Heart defect Brother   . Prostate cancer Maternal Uncle    . Alcoholism Other        Paternal Side  . Diabetes Maternal Aunt   . Diabetes Maternal Uncle     Social History   Socioeconomic History  . Marital status: Married    Spouse name: Not on file  . Number of children: 1  . Years of education: Not on file  . Highest education level: Not on file  Social Needs  . Financial resource strain: Not on file  . Food insecurity - worry: Not on file  . Food insecurity - inability: Not on file  . Transportation needs - medical: Not on file  . Transportation needs - non-medical: Not on file  Occupational History  . Occupation: EMT  Tobacco Use  . Smoking status: Never Smoker  . Smokeless tobacco: Never Used  Substance and Sexual Activity  . Alcohol use: No    Alcohol/week: 0.0 oz    Frequency: Never  . Drug use: No  . Sexual activity: Yes    Partners: Male  Other Topics Concern  . Not on file  Social History Narrative   Lives with husband in a one story home.  No children.     Works as an Museum/gallery exhibitions officerMT.     Education: associates degree.     Physical Exam: Ht 5\' 8"  (1.727 m) Comment: height measured without shoes  Wt 245 lb 2 oz (111.2 kg)   BMI 37.27 kg/m  Constitutional: Obese, otherwise well-appearing Psychiatric: alert and oriented x3 Eyes: extraocular movements intact Mouth: oral pharynx moist, no lesions Neck: supple no lymphadenopathy Cardiovascular: heart regular rate and rhythm Lungs: clear to auscultation bilaterally Abdomen: soft, nontender, nondistended, no obvious ascites, no peritoneal signs, normal bowel sounds Extremities: no lower extremity edema bilaterally Skin: no lesions on visible extremities   Assessment and plan: 32 y.o. female with chronically elevated liver tests, chronic pancreatic cyst, intermittent abdominal pains, history of colonoscopies with colon polyps  First the pancreatic cyst has been present since at least 2013 based on the above imaging.  It has probably grown slightly in size however there  are still no concerning morphologic features based on MRI October 2018.  I recommended that we follow 2015 AGA incidental pancreatic cyst guidelines here.  She has no concerning morphologic features and will follow this with repeat MRI in October 2020.  She is chronically elevated liver tests.  She says these have been evaluated numerous times in the past by multiple providers and she knows that what she has is fatty liver.  I offered further testing for this but she declined.  I think she is probably correct that she has fatty liver.  I recommended weight loss trial for this and monitoring her liver tests periodically.  She tells me she has had at least 2 colonoscopies in the past  many years.  Most recent was 9 or 10 years ago.  She was found to have polyps she understands on both of these.  She is not clear what type of pathology these were and so we will attempt to get those sent here for further review and I will advise on timing of next colon cancer screening, polyp surveillance testing  after I see those results.    Please see the "Patient Instructions" section for addition details about the plan.   Rob Bunting, MD [facility] 04/15/2017, 11:28 AM  Cc: Sharlene Dory*

## 2017-04-15 NOTE — Patient Instructions (Addendum)
MRI with MRCP 12/2018 for pancreatic cyst surveillance (per 2015 AGA Guidelines on Incidental Pancreatic cysts).  We will get records sent from your previous gastroenterologist (in [place], [facility]; colonoscopies in 2004 and in 2009) for review.  This will include any endoscopic (colonoscopy or upper endoscopy) procedures and any associated pathology reports.   Normal BMI (Body Mass Index- based on height and weight) is between 19 and 25. Your BMI today is Body mass index is 37.27 kg/m. Marland Kitchen. Please consider follow up  regarding your BMI with your Primary Care Provider.

## 2017-04-16 ENCOUNTER — Telehealth: Payer: Self-pay | Admitting: Gastroenterology

## 2017-04-16 NOTE — Telephone Encounter (Signed)
   I reviewed an office note from an [facility] provider named Celso Amyina Garrett 01/2017.  In this note she documents a discussion with her previous gastroenterologist Dr. 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  He did not recommend upper endoscopy with ultrasound.  A CA-19-9 level was checked November 2018; 38 (upper limit of normal 35)  EGD, Dr. Dulce Sellarutlaw, June 2016.  Indications "epigastric abdominal pain, right upper quadrant pain, periumbilical abdominal pain".  The examination was completely normal.  She was recommended to try antispasmodics.

## 2017-04-30 MED FILL — PIOGLITAZONE HCL 30 MG TAB: 30 | 30 days supply | Qty: 30 | Fill #1

## 2017-04-30 MED FILL — tiZANidine HCL 4 MG TABS: 4 | 15 days supply | Qty: 60 | Fill #1

## 2017-04-30 MED FILL — TRULICITY 1.5 MG/0.5 ML PEN: 1.5 | 28 days supply | Qty: 2 | Fill #2

## 2017-05-09 ENCOUNTER — Telehealth: Payer: 59 | Admitting: Physician Assistant

## 2017-05-09 ENCOUNTER — Ambulatory Visit (INDEPENDENT_AMBULATORY_CARE_PROVIDER_SITE_OTHER): Payer: 59 | Admitting: Family Medicine

## 2017-05-09 ENCOUNTER — Encounter: Payer: Self-pay | Admitting: Family Medicine

## 2017-05-09 VITALS — BP 128/80 | HR 76 | Temp 98.8°F | Ht 68.0 in | Wt 251.0 lb

## 2017-05-09 DIAGNOSIS — R6889 Other general symptoms and signs: Secondary | ICD-10-CM

## 2017-05-09 DIAGNOSIS — R0602 Shortness of breath: Secondary | ICD-10-CM

## 2017-05-09 MED ORDER — HYDROCODONE-HOMATROPINE 5-1.5 MG/5ML PO SYRP: 5.0000 mL | ORAL_SOLUTION | Freq: Four times a day (QID) | ORAL | 0 refills | Status: DC | PRN

## 2017-05-09 MED ORDER — ONDANSETRON HCL 4 MG PO TABS: 4.0000 mg | ORAL_TABLET | Freq: Three times a day (TID) | ORAL | 0 refills | Status: DC | PRN

## 2017-05-09 MED FILL — HYDROCODONE-HOMATROPINE SOL: 5-1.5 | 6 days supply | Qty: 120 | Fill #0

## 2017-05-09 MED FILL — ONDANSETRON HCL 4 MG TABLET: 4 | 10 days supply | Qty: 30 | Fill #0

## 2017-05-09 NOTE — Progress Notes (Signed)
Based on what you shared with me it looks like you have a condition that should be evaluated in a face to face office visit. This could be the flu although if symptoms have been present greater than 2 days, you are outside the typical window where Tamiflu can help. Giving the shortness of breath, you need an in office assessment for good lung examination and so the appropriate treatment can be given.   NOTE: If you entered your credit card information for this eVisit, you will not be charged. You may see a "hold" on your card for the $30 but that hold will drop off and you will not have a charge processed.  If you are having a true medical emergency please call 911.  If you need an urgent face to face visit, [facility] has four urgent care centers for your convenience.  If you need care fast and have a high deductible or no insurance consider:   [email or web address] to reserve your spot online an avoid wait times  [facility] [address], [address] [place], [place] [address] 8 am to 8 pm Monday-Friday 10 am to 4 pm Saturday-Sunday *Across the street from [employer]  [facility]  [address] [place], [id number] 8 am to 5 pm Monday-Friday * In the [facility] on the [facility]   The following sites will take your  insurance:  . [facility]  [phone] Get Driving Directions Find a Provider at this Location  [address] [place], [place] [address] . 10 am to 8 pm Monday-Friday . 12 pm to 8 pm Saturday-Sunday   . [facility]  [phone] Get Driving Directions Find a Provider at this Location  [address] [place], [place] [address] . 8 am to 8 pm Monday-Friday . 9 am to 6 pm Saturday . 11 am to 6 pm Sunday   . [facility] at [facility] [place]  [phone] Get Driving Directions  [address] [place], [place]  [address] . 8 am to 8 pm Monday-Friday . 8 am to 4 pm Saturday-Sunday   Your e-visit answers were reviewed by a board certified advanced clinical practitioner to complete your personal care plan.  Thank you for using e-Visits.

## 2017-05-09 NOTE — Patient Instructions (Addendum)
Continue to push fluids, practice good hand hygiene, and cover your mouth if you cough.  If you start having increasing fevers, shaking or worsening shortness of breath, seek immediate care.  Ibuprofen 400-600 mg (2-3 over the counter strength tabs) every 6 hours as needed for pain.  OK to take Tylenol 1000 mg (2 extra strength tabs) or 975 mg (3 regular strength tabs) every 6 hours as needed.  Let us know if you need anything.

## 2017-05-09 NOTE — Progress Notes (Signed)
Chief Complaint  Patient presents with  . Cough  . Nausea  . Chills    Veronica Mclaughlin here for URI complaints.   Duration: 5 days  Associated symptoms: Fever (100 F), sinus congestion, rhinorrhea, myalgias, and cough; having some nausea Denies: itchy watery eyes, ear pain, ear drainage, sore throat and wheezing Treatment to date: ibuprofen, Tylenol, Benadryl, Nyquil, Dayquil Sick contacts: Yes- works in ED  ROS:  Const: +fevers HEENT: As noted in HPI Lungs: No SOB  Past Medical History:  Diagnosis Date  . Diabetes mellitus type II, controlled (HCC)   . Diabetic neuropathy (HCC)   . Gallstones   . History of colonic polyps   . HTN (hypertension)   . Pancreatic cyst   . PCOS (polycystic ovarian syndrome)    Family History  Problem Relation Age of Onset  . Diabetes Mother   . Hypertension Mother   . Uterine cancer Mother   . Thyroid disease Mother   . Diabetes type II Father   . Hypertension Father   . Gout Father   . Neuropathy Father   . Breast cancer Maternal Grandmother   . Heart attack Paternal Grandfather [phone]  . Colon cancer Maternal Grandfather   . Pancreatic cancer Maternal Grandfather   . Heart defect Brother   . Prostate cancer Maternal Uncle   . Alcoholism Other        Paternal Side  . Diabetes Maternal Aunt   . Diabetes Maternal Uncle     BP 128/80 (BP Location: Left Arm, Patient Position: Sitting, Cuff Size: Large)   Pulse 76   Temp 98.8 F (37.1 C) (Oral)   Ht 5\' 8"  (1.727 m)   Wt 251 lb (113.9 kg)   SpO2 98%   BMI 38.16 kg/m  General: Awake, alert, appears stated age HEENT: AT, [place], ears patent b/l and TM's neg, nares patent w/o discharge, pharynx pink and without exudates, MMM Neck: No masses or asymmetry Heart: RRR Lungs: CTAB, no accessory muscle use Psych: Age appropriate judgment and insight, normal mood and affect  Flu-like symptoms - Plan: HYDROcodone-homatropine (HYCODAN) 5-1.5 MG/5ML syrup, ondansetron (ZOFRAN) 4 MG  tablet  Outside window for Tamiflu. NSAIDs, Tylenol, fluids. Warning for syrup given.  Continue to push fluids, practice good hand hygiene, cover mouth when coughing. F/u prn. If starting to experience increasing fevers, shaking, or shortness of breath, seek immediate care. Pt voiced understanding and agreement to the plan.  Veronica Rocheicholas Paul W[place] WheelWendling, DO 05/09/17 3:44 PM

## 2017-05-09 NOTE — Progress Notes (Signed)
Pre visit review using our clinic review tool, if applicable. No additional management support is needed unless otherwise documented below in the visit note. 

## 2017-05-21 MED FILL — ESCITALOPRAM 10 MG TABLET: 10 | 30 days supply | Qty: 30 | Fill #0

## 2017-05-24 ENCOUNTER — Ambulatory Visit (INDEPENDENT_AMBULATORY_CARE_PROVIDER_SITE_OTHER): Payer: 59 | Admitting: Family Medicine

## 2017-05-24 ENCOUNTER — Encounter: Payer: Self-pay | Admitting: Family Medicine

## 2017-05-24 VITALS — BP 120/80 | HR 75 | Temp 99.5°F | Ht 68.0 in | Wt 244.0 lb

## 2017-05-24 DIAGNOSIS — Z2821 Immunization not carried out because of patient refusal: Secondary | ICD-10-CM

## 2017-05-24 DIAGNOSIS — E1165 Type 2 diabetes mellitus with hyperglycemia: Secondary | ICD-10-CM

## 2017-05-24 LAB — COMPREHENSIVE METABOLIC PANEL
ALK PHOS: 40 U/L (ref 39–117)
ALT: 41 U/L — AB (ref 0–35)
AST: 26 U/L (ref 0–37)
Albumin: 3.7 g/dL (ref 3.5–5.2)
BILIRUBIN TOTAL: 0.6 mg/dL (ref 0.2–1.2)
BUN: 16 mg/dL (ref 6–23)
CALCIUM: 8.6 mg/dL (ref 8.4–10.5)
CO2: 28 mEq/L (ref 19–32)
Chloride: 104 mEq/L (ref 96–112)
Creatinine, Ser: 0.75 mg/dL (ref 0.40–1.20)
GFR: 96.88 mL/min (ref >60.00)
Glucose, Bld: 144 mg/dL — ABNORMAL HIGH (ref 70–99)
Potassium: 4 mEq/L (ref 3.5–5.1)
Sodium: 138 mEq/L (ref 135–145)
TOTAL PROTEIN: 6.6 g/dL (ref 6.0–8.3)

## 2017-05-24 LAB — HEMOGLOBIN A1C: Hgb A1c MFr Bld: 6.4 % (ref 4.6–6.5)

## 2017-05-24 NOTE — Progress Notes (Signed)
Subjective:   Chief Complaint  Patient presents with  . Follow-up    diabetes    Veronica GentryKayla Emerick is a 32 y.o. female here for follow-up of diabetes.   She checks her glucose levels: 0 times/week. Patient does not require insulin.   Medications include: Metformin 750 mg XR daily, Trulicity 1.5 mg/week, Actos 30 mg/d Patient exercises 2 days per week on average.   Last A1c 7.9 Recently started on Actos and doing much better.  Past Medical History:  Diagnosis Date  . Diabetes mellitus type II, controlled (HCC)   . Diabetic neuropathy (HCC)   . Gallstones   . History of colonic polyps   . HTN (hypertension)   . Pancreatic cyst   . PCOS (polycystic ovarian syndrome)      Related testing: Foot exam(monofilament and inspection):done Retinal exam:done Date of retinal exam: 12/2016  Done by:  [facility] Pneumovax: refuses Flu Shot: done  Review of Systems: Eye:  No recent significant change in vision Pulmonary:  No SOB Cardiovascular:  No chest pain, no palpitations Skin/Integumentary ROS:  No abnormal skin lesions reported Neurologic:  No numbness, tingling  Objective:  BP 120/80 (BP Location: Left Arm, Patient Position: Sitting, Cuff Size: Large)   Pulse 75   Temp 99.5 F (37.5 C) (Oral)   Ht 5\' 8"  (1.727 m)   Wt 244 lb (110.7 kg)   SpO2 97%   BMI 37.10 kg/m  General:  Well developed, well nourished, in no apparent distress Eyes:  Pupils equal and round, sclera anicteric without injection  Nose:  External nares without trauma, no discharge Throat/Pharynx:  Lips and gingiva without lesion Neck: Neck supple.  No obvious thyromegaly or masses.  No bruits Lungs:  clear to auscultation, breath sounds equal bilaterally, no wheezes, rales, or stridor Cardio:  regular rate and rhythm without murmurs, no bruits, no LE edema Musculoskeletal:  Symmetrical muscle groups noted without atrophy or deformity Neuro:  Sensation intact to pinprick on feet Psych: Age appropriate judgment  and insight  Assessment:   Uncontrolled type 2 diabetes mellitus with hyperglycemia (HCC) - Plan: Hemoglobin A1c, Comprehensive metabolic panel  Refused pneumococcal vaccination   Plan:   Orders as above.  Very optimistic about A1c after addition of Actos.  Offered PCV23, does not want. Ck LFT's and renal function today.  F/u in 3 mo. The patient voiced understanding and agreement to the plan.  Jilda Rocheicholas Paul EatonWendling, DO 05/24/17 12:19 PM

## 2017-05-24 NOTE — Patient Instructions (Addendum)
Keep the diet clean.   Stay physically active.  Give us 2-3 business days to get the results of your labs back. If labs are normal, you will likely receive a letter in the mail unless you have MyChart. This can take longer than 2-3 business days.   Let us know if you need anything.

## 2017-05-24 NOTE — Progress Notes (Signed)
Pre visit review using our clinic review tool, if applicable. No additional management support is needed unless otherwise documented below in the visit note. 

## 2017-06-05 MED FILL — PIOGLITAZONE HCL 30 MG TAB: 30 | 30 days supply | Qty: 30 | Fill #2

## 2017-06-05 MED FILL — TRULICITY 1.5 MG/0.5 ML PEN: 1.5 | 28 days supply | Qty: 2 | Fill #3

## 2017-06-18 MED FILL — METFORMIN HCL ER 750 MG TAB: 750 | 90 days supply | Qty: 90 | Fill #1

## 2017-06-18 MED FILL — traZODone HCL 50 MG TABS: 50 | 30 days supply | Qty: 30 | Fill #1

## 2017-06-25 MED FILL — ESCITALOPRAM 10 MG TABLET: 10 | 30 days supply | Qty: 30 | Fill #1

## 2017-07-09 ENCOUNTER — Other Ambulatory Visit: Payer: Self-pay | Admitting: Family Medicine

## 2017-07-09 DIAGNOSIS — IMO0002 Reserved for concepts with insufficient information to code with codable children: Secondary | ICD-10-CM

## 2017-07-09 DIAGNOSIS — E1142 Type 2 diabetes mellitus with diabetic polyneuropathy: Secondary | ICD-10-CM

## 2017-07-09 DIAGNOSIS — E1165 Type 2 diabetes mellitus with hyperglycemia: Principal | ICD-10-CM

## 2017-07-09 MED FILL — LABETALOL HCL 200 MG TABLET: 200 | 90 days supply | Qty: 90 | Fill #3

## 2017-07-10 MED FILL — TRULICITY 1.5 MG/0.5 ML PEN: 1.5 | 28 days supply | Qty: 2 | Fill #0

## 2017-07-10 MED FILL — PIOGLITAZONE HCL 30 MG TAB: 30 | 30 days supply | Qty: 30 | Fill #0

## 2017-07-15 ENCOUNTER — Other Ambulatory Visit: Payer: Self-pay | Admitting: Family Medicine

## 2017-07-15 DIAGNOSIS — K862 Cyst of pancreas: Secondary | ICD-10-CM

## 2017-07-18 ENCOUNTER — Other Ambulatory Visit: Payer: Self-pay | Admitting: Family Medicine

## 2017-07-18 DIAGNOSIS — Z01812 Encounter for preprocedural laboratory examination: Secondary | ICD-10-CM

## 2017-07-25 DIAGNOSIS — Z01419 Encounter for gynecological examination (general) (routine) without abnormal findings: Secondary | ICD-10-CM | POA: Diagnosis not present

## 2017-07-31 ENCOUNTER — Other Ambulatory Visit: Payer: Self-pay | Admitting: Family Medicine

## 2017-07-31 DIAGNOSIS — G44209 Tension-type headache, unspecified, not intractable: Secondary | ICD-10-CM

## 2017-07-31 MED FILL — tiZANidine HCL 4 MG TABS: 4 | 15 days supply | Qty: 60 | Fill #0

## 2017-08-05 ENCOUNTER — Encounter: Payer: Self-pay | Admitting: Family Medicine

## 2017-08-06 MED FILL — ESCITALOPRAM 5 MG TABLET: 5 | 90 days supply | Qty: 90 | Fill #0

## 2017-08-13 MED FILL — PIOGLITAZONE HCL 30 MG TAB: 30 | 30 days supply | Qty: 30 | Fill #1

## 2017-08-13 MED FILL — TRULICITY 1.5 MG/0.5 ML PEN: 1.5 | 28 days supply | Qty: 2 | Fill #1

## 2017-08-29 ENCOUNTER — Ambulatory Visit (INDEPENDENT_AMBULATORY_CARE_PROVIDER_SITE_OTHER): Payer: 59 | Admitting: Family Medicine

## 2017-08-29 ENCOUNTER — Encounter: Payer: Self-pay | Admitting: Family Medicine

## 2017-08-29 VITALS — BP 112/80 | HR 80 | Temp 99.0°F | Ht 68.0 in | Wt 258.0 lb

## 2017-08-29 DIAGNOSIS — E1169 Type 2 diabetes mellitus with other specified complication: Secondary | ICD-10-CM

## 2017-08-29 DIAGNOSIS — H6981 Other specified disorders of Eustachian tube, right ear: Secondary | ICD-10-CM

## 2017-08-29 DIAGNOSIS — E669 Obesity, unspecified: Secondary | ICD-10-CM | POA: Diagnosis not present

## 2017-08-29 LAB — HEMOGLOBIN A1C: HEMOGLOBIN A1C: 5.9 % (ref 4.6–6.5)

## 2017-08-29 MED ORDER — PIOGLITAZONE HCL 30 MG PO TABS: 30.0000 mg | ORAL_TABLET | Freq: Every day | ORAL | 3 refills | Status: AC

## 2017-08-29 MED ORDER — PREDNISONE 20 MG PO TABS: 40.0000 mg | ORAL_TABLET | Freq: Every day | ORAL | 0 refills | Status: AC

## 2017-08-29 MED FILL — predniSONE 20 MG TABS: 20 | 5 days supply | Qty: 10 | Fill #0

## 2017-08-29 NOTE — Progress Notes (Signed)
Subjective:   Chief Complaint  Patient presents with  . Follow-up    diabetes    Veronica Mclaughlin is a 32 y.o. female here for follow-up of diabetes.   Veronica Mclaughlin does not routinely monitor sugars.  Patient does not require insulin.   Medications include: Metformin XR 750 grams daily, Actos 30 mg daily, Trulicity 1.5 mg weekly Reports diet is somewhat healthy overall. Patient exercises most days per week on average- walking.  Duration: 4 days  Associated symptoms: R ear pain, sinus congestion, rhinorrhea, sore throat and cough Denies: sinus pain, shortness of breath and fevers Treatment to date: OTC Sick contacts: No     Past Medical History:  Diagnosis Date  . Diabetes mellitus type II, controlled (HCC)   . Diabetic neuropathy (HCC)   . Gallstones   . History of colonic polyps   . HTN (hypertension)   . Pancreatic cyst   . PCOS (polycystic ovarian syndrome)        Current Outpatient Medications on File Prior to Visit  Medication Sig Dispense Refill  . diphenhydrAMINE (SOMINEX) 25 MG tablet Take 25 mg by mouth at bedtime as needed. Benadryl    . escitalopram (LEXAPRO) 10 MG tablet Take 10 mg by mouth daily.    Marland Kitchen. ipratropium (ATROVENT) 0.03 % nasal spray Place 2 sprays into both nostrils every 12 (twelve) hours. 30 mL 12  . metFORMIN (GLUCOPHAGE-XR) 750 MG 24 hr tablet Take 1 tablet (750 mg total) by mouth daily with breakfast. 90 tablet 3  . pioglitazone (ACTOS) 30 MG tablet TAKE 1 TABLET BY MOUTH DAILY 30 tablet 2  . tiZANidine (ZANAFLEX) 4 MG tablet TAKE 1 TABLET (4 MG TOTAL) BY MOUTH EVERY 6 (SIX) HOURS AS NEEDED FOR MUSCLE SPASMS. 60 tablet 1  . traZODone (DESYREL) 50 MG tablet Take 0.5-1 tablets (25-50 mg total) by mouth at bedtime as needed for sleep. 30 tablet 3  . TRULICITY 1.5 MG/0.5ML SOPN INJECT 1.5 MG (0.5 ML) WEEKLY. 2 mL 3   Related testing: Date of retinal exam: Coming up in July Pneumovax: refuses   Review of Systems: Pulmonary:  No SOB Cardiovascular:  No  chest pain  Objective:  BP 112/80 (BP Location: Left Arm, Patient Position: Sitting, Cuff Size: Large)   Pulse 80   Temp 99 F (37.2 C) (Oral)   Ht 5\' 8"  (1.727 m)   Wt 258 lb (117 kg)   SpO2 97%   BMI 39.23 kg/m  General:  Well developed, well nourished, in no apparent distress HEENT: Ears neg b/l, nares neg, throat pink and w/o exudate Head:  Normocephalic, atraumatic Eyes:  Pupils equal and round, sclera anicteric without injection  Lungs:  CTAB, no access msc use Cardio:  RRR, no bruits, no LE edema Psych: Age appropriate judgment and insight  Assessment:   Diabetes mellitus type 2 in obese (HCC) - Plan: Hemoglobin A1c, Semaglutide (OZEMPIC) 1 MG/DOSE SOPN, pioglitazone (ACTOS) 30 MG tablet  Dysfunction of right eustachian tube - Plan: predniSONE (DELTASONE) 20 MG tablet   Plan:   Orders as above. Change Trulicity to Ozempic.  Refill Actos.  Check A1c.  Given her recent weight gain, may see an increase in this lab.  Counseled on diet and exercise. Supportive care for URI.  Prednisone burst to help with the right ear. Counseled on diet and exercise. F/u in 6 mo. The patient voiced understanding and agreement to the plan.  Jilda Rocheicholas Paul WallisWendling, DO 08/29/17 12:12 PM

## 2017-08-29 NOTE — Patient Instructions (Signed)
Take 1 mg of the Ozempic in place of the Trulicity, it is still weekly.  We will be in touch regarding the A1c results.  Keep the diet clean and stay active.  Mind your blood pressure over next couple weeks and let me know if elevated.  Let us know if you need anything.

## 2017-08-29 NOTE — Progress Notes (Signed)
Pre visit review using our clinic review tool, if applicable. No additional management support is needed unless otherwise documented below in the visit note. 

## 2017-08-30 ENCOUNTER — Ambulatory Visit: Payer: 59 | Admitting: Family Medicine

## 2017-09-12 MED FILL — PIOGLITAZONE HCL 30 MG TAB: 30 | 90 days supply | Qty: 90 | Fill #0

## 2017-09-24 MED FILL — METFORMIN HCL ER 750 MG TAB: 750 | 90 days supply | Qty: 90 | Fill #2

## 2017-09-25 ENCOUNTER — Encounter: Payer: Self-pay | Admitting: Family Medicine

## 2017-09-25 ENCOUNTER — Telehealth: Payer: Self-pay

## 2017-09-25 DIAGNOSIS — E1169 Type 2 diabetes mellitus with other specified complication: Secondary | ICD-10-CM

## 2017-09-25 DIAGNOSIS — E669 Obesity, unspecified: Principal | ICD-10-CM

## 2017-09-25 MED ORDER — SEMAGLUTIDE (1 MG/DOSE) 2 MG/1.5ML ~~LOC~~ SOPN: 1.0000 mg | PEN_INJECTOR | SUBCUTANEOUS | 2 refills | Status: AC

## 2017-09-25 MED FILL — OZEMPIC 1 MG/DOSE SOPN: 2 | 28 days supply | Qty: 3 | Fill #0

## 2017-09-25 NOTE — Telephone Encounter (Signed)
PA initiated via Covermymeds; KEY: [id number]. Awaiting determination.

## 2017-09-30 NOTE — Telephone Encounter (Signed)
PA approved.

## 2017-10-10 MED FILL — tiZANidine HCL 4 MG TABS: 4 | 15 days supply | Qty: 60 | Fill #1

## 2017-10-29 MED FILL — OZEMPIC 1 MG/DOSE SOPN: 2 | 28 days supply | Qty: 3 | Fill #1

## 2017-10-29 MED FILL — ESCITALOPRAM 5 MG TABLET: 5 | 90 days supply | Qty: 90 | Fill #1

## 2017-11-07 ENCOUNTER — Telehealth: Payer: 59 | Admitting: Physician Assistant

## 2017-11-07 DIAGNOSIS — M545 Low back pain, unspecified: Secondary | ICD-10-CM

## 2017-11-07 MED ORDER — CYCLOBENZAPRINE HCL 10 MG PO TABS
10.0000 mg | ORAL_TABLET | Freq: Three times a day (TID) | ORAL | 0 refills | Status: AC | PRN
Start: 2017-11-07 — End: ?

## 2017-11-07 MED FILL — traZODone HCL 50 MG TABS: 50 | 30 days supply | Qty: 30 | Fill #2

## 2017-11-07 MED FILL — CYCLOBENZAPRINE HCL 10 MG T: 10 | 10 days supply | Qty: 30 | Fill #0

## 2017-11-07 NOTE — Progress Notes (Signed)

## 2017-11-08 ENCOUNTER — Ambulatory Visit: Payer: 59 | Admitting: Family Medicine

## 2017-11-20 MED FILL — OZEMPIC 1 MG/DOSE SOPN: 2 | 28 days supply | Qty: 3 | Fill #2

## 2017-12-02 ENCOUNTER — Encounter: Payer: Self-pay | Admitting: Family Medicine

## 2018-01-06 ENCOUNTER — Telehealth: Payer: Self-pay | Admitting: *Deleted

## 2018-01-06 NOTE — Telephone Encounter (Signed)
Received request for Medical Records from [facility], last [5] years of all records with [place] to be sent; verified with patient via telephone. Will forward form to [facility] Records Department via email/scan/SLS

## 2018-09-16 ENCOUNTER — Telehealth: Payer: Self-pay

## 2018-09-16 NOTE — Telephone Encounter (Signed)
PA initiated via Covermymeds; KEY: [id number]. Awaiting determination.

## 2018-09-18 NOTE — Telephone Encounter (Signed)
PA denied. Pt not eligible for pharmacy benefits at this time.

## 2019-01-12 ENCOUNTER — Telehealth: Payer: Self-pay

## 2019-01-12 NOTE — Telephone Encounter (Signed)
-----   Message from Timothy Lasso, RN sent at 12/25/2018  8:25 AM EDT -----  ----- Message ----- From: Timothy Lasso, RN Sent: 12/25/2018 To: Jeoffrey Massed, RN  MRI with MRCP 12/2018 for pancreatic cyst surveillance (per 2015 AGA Guidelines on Incidental Pancreatic cysts).

## 2019-01-12 NOTE — Telephone Encounter (Signed)
I spoke with the pt to get her set up for follow up MRI/MRCP and she tells me that she has actually transferred to another GI practice.
